# Patient Record
Sex: Female | Born: 1985 | Race: White | Hispanic: No | Smoking: Never smoker
Health system: Southern US, Community
[De-identification: ages and names within clinical notes are randomized; demographics above are authoritative.]

## PROBLEM LIST (undated history)

## (undated) DIAGNOSIS — E079 Disorder of thyroid, unspecified: Secondary | ICD-10-CM

## (undated) DIAGNOSIS — E282 Polycystic ovarian syndrome: Secondary | ICD-10-CM

## (undated) DIAGNOSIS — R51 Headache: Secondary | ICD-10-CM

## (undated) DIAGNOSIS — I10 Essential (primary) hypertension: Secondary | ICD-10-CM

## (undated) DIAGNOSIS — R519 Headache, unspecified: Secondary | ICD-10-CM

## (undated) HISTORY — DX: Polycystic ovarian syndrome: E28.2

## (undated) HISTORY — DX: Headache: R51

## (undated) HISTORY — DX: Disorder of thyroid, unspecified: E07.9

## (undated) HISTORY — DX: Essential (primary) hypertension: I10

## (undated) HISTORY — DX: Headache, unspecified: R51.9

## (undated) HISTORY — PX: BIOPSY THYROID: PRO38

---

## 2012-03-17 ENCOUNTER — Other Ambulatory Visit: Payer: Self-pay

## 2012-03-17 ENCOUNTER — Encounter (HOSPITAL_COMMUNITY): Payer: Self-pay | Admitting: Emergency Medicine

## 2012-03-17 ENCOUNTER — Emergency Department (HOSPITAL_COMMUNITY)
Admission: EM | Admit: 2012-03-17 | Discharge: 2012-03-18 | Disposition: A | Discharge status: Home / Self Care | Payer: PRIVATE HEALTH INSURANCE | Attending: Emergency Medicine | Admitting: Emergency Medicine

## 2012-03-17 DIAGNOSIS — R079 Chest pain, unspecified: Secondary | ICD-10-CM | POA: Insufficient documentation

## 2012-03-17 LAB — BASIC METABOLIC PANEL
CO2: 27 mEq/L (ref 19–32)
Chloride: 98 mEq/L (ref 96–112)
Potassium: 3.9 mEq/L (ref 3.5–5.1)
Sodium: 136 mEq/L (ref 135–145)

## 2012-03-17 LAB — CBC
MCV: 84.8 fL (ref 78.0–100.0)
Platelets: 335 10*3/uL (ref 150–400)
RBC: 4.86 MIL/uL (ref 3.87–5.11)
WBC: 8.4 10*3/uL (ref 4.0–10.5)

## 2012-03-17 LAB — PREGNANCY, URINE: Preg Test, Ur: NEGATIVE

## 2012-03-17 NOTE — ED Notes (Signed)
Pt alert, arrives from home, c/o mid epigastric non radiating chest pain, onset several weeks ago, resp even unlabored, skin pwd, denies n/v

## 2012-03-18 ENCOUNTER — Emergency Department (HOSPITAL_COMMUNITY): Payer: PRIVATE HEALTH INSURANCE

## 2012-03-18 LAB — TROPONIN I: Troponin I: <0.3 ng/mL (ref <0.30)

## 2012-03-18 NOTE — ED Provider Notes (Signed)
History     CSN: [id number]  Arrival date & time 03/17/12  2305   First MD Initiated Contact with Patient 03/18/12 [phone]      Chief Complaint  Patient presents with  . Chest Pain    (Consider location/radiation/quality/duration/timing/severity/associated sxs/prior treatment) HPI This is a 26 year old white female with a history of chest pain. She has had several weeks of a burning pain in her chest. Since 5:30 PM yesterday the pain has become more of a dull, aching pain. It is located in the left parasternal and submammary areas. It is sometimes worse with deep breathing. It is not worse with palpation or movement. It is mild to moderate at its worst. It is intermittent lasting several minutes at a time. There is no specific trigger known. It is not associated with shortness of breath, nausea or diaphoresis. She denies smoking, birth control use, family history of coronary artery disease, lower extremity swelling or edema, or recent prolonged automobile or airplane trip.   History reviewed. No pertinent past medical history.  History reviewed. No pertinent past surgical history.  No family history on file.  History  Substance Use Topics  . Smoking status: Never Smoker   . Smokeless tobacco: Not on file  . Alcohol Use: No    OB History    Grav Para Term Preterm Abortions TAB SAB Ect Mult Living                  Review of Systems  All other systems reviewed and are negative.    Allergies  Review of patient's allergies indicates no known allergies.  Home Medications   Current Outpatient Rx  Name Route Sig Dispense Refill  . IBUPROFEN 200 MG PO TABS Oral Take 600 mg by mouth every 6 (six) hours as needed. Pain      BP 123/74  Pulse 65  Temp 98.3 F (36.8 C) (Oral)  Resp 20  Wt 270 lb (122.471 kg)  SpO2 99%  LMP 02/13/2012  Physical Exam General: Well-developed, well-nourished female in no acute distress; appearance consistent with age of record HENT:  normocephalic, atraumatic Eyes: pupils equal round and reactive to light; extraocular muscles intact Neck: supple Heart: regular rate and rhythm; no murmurs, rubs or gallops Lungs: clear to auscultation bilaterally Chest: Nontender Abdomen: soft; nondistended Extremities: No deformity; full range of motion; pulses normal; no edema Neurologic: Awake, alert and oriented; motor function intact in all extremities and symmetric; no facial droop Skin: Warm and dry Psychiatric: Normal mood and affect    ED Course  Procedures (including critical care time)    MDM   Nursing notes and vitals signs, including pulse oximetry, reviewed.  Summary of this visit's results, reviewed by myself:  Labs:  Results for orders placed during the hospital encounter of 03/17/12  CBC      Component Value Range   WBC 8.4  4.0 - 10.5 K/uL   RBC 4.86  3.87 - 5.11 MIL/uL   Hemoglobin 14.1  12.0 - 15.0 g/dL   HCT 11.9  14.7 - 82.9 %   MCV 84.8  78.0 - 100.0 fL   MCH 29.0  26.0 - 34.0 pg   MCHC 34.2  30.0 - 36.0 g/dL   RDW 56.2  13.0 - 86.5 %   Platelets 335  150 - 400 K/uL  BASIC METABOLIC PANEL      Component Value Range   Sodium 136  135 - 145 mEq/L   Potassium 3.9  3.5 - 5.1  mEq/L   Chloride 98  96 - 112 mEq/L   CO2 27  19 - 32 mEq/L   Glucose, Bld 93  70 - 99 mg/dL   BUN 11  6 - 23 mg/dL   Creatinine, Ser 4.09  0.50 - 1.10 mg/dL   Calcium 9.3  8.4 - 81.1 mg/dL   GFR calc non Af Amer 74 (*) >90 mL/min   GFR calc Af Amer 86 (*) >90 mL/min  PREGNANCY, URINE      Component Value Range   Preg Test, Ur NEGATIVE  NEGATIVE  TROPONIN I      Component Value Range   Troponin I <0.30  <0.30 ng/mL    Imaging Studies: Dg Chest 2 View  03/18/2012  *RADIOLOGY REPORT*  Clinical Data: Chest pain.  CHEST - 2 VIEW  Comparison: None.  Findings:  Cardiopericardial silhouette within normal limits. Mediastinal contours normal. Trachea midline.  No airspace disease or effusion.  IMPRESSION: No active  cardiopulmonary disease.   Original Report Authenticated By: Andreas Newport, M.D.    EKG Interpretation:  Date & Time: 03/17/2012 11:28 AM  Rate: 63  Rhythm: normal sinus rhythm  QRS Axis: normal  Intervals: normal  ST/T Wave abnormalities: normal  Conduction Disutrbances:nonspecific intraventricular conduction delay  Narrative Interpretation:   Old EKG Reviewed: none available  5:51 AM will refer to [facility]. Differential diagnosis includes mitral valve prolapse which can cause atypical chest pain.             Hanley Seamen, MD 03/18/12 [phone]

## 2014-06-30 ENCOUNTER — Other Ambulatory Visit: Payer: Self-pay | Admitting: Family Medicine

## 2014-06-30 DIAGNOSIS — R7989 Other specified abnormal findings of blood chemistry: Secondary | ICD-10-CM

## 2014-07-03 ENCOUNTER — Other Ambulatory Visit: Payer: PRIVATE HEALTH INSURANCE

## 2014-10-13 ENCOUNTER — Other Ambulatory Visit: Payer: Self-pay | Admitting: Family Medicine

## 2014-10-13 DIAGNOSIS — R7989 Other specified abnormal findings of blood chemistry: Secondary | ICD-10-CM

## 2014-11-06 ENCOUNTER — Ambulatory Visit
Admission: RE | Admit: 2014-11-06 | Discharge: 2014-11-06 | Disposition: A | Discharge status: Home / Self Care | Payer: 59 | Source: Ambulatory Visit | Attending: Family Medicine | Admitting: Family Medicine

## 2014-11-06 DIAGNOSIS — R7989 Other specified abnormal findings of blood chemistry: Secondary | ICD-10-CM

## 2014-11-19 ENCOUNTER — Other Ambulatory Visit: Payer: Self-pay | Admitting: Family Medicine

## 2014-11-19 DIAGNOSIS — E041 Nontoxic single thyroid nodule: Secondary | ICD-10-CM

## 2014-12-22 ENCOUNTER — Ambulatory Visit
Admission: RE | Admit: 2014-12-22 | Discharge: 2014-12-22 | Disposition: A | Discharge status: Home / Self Care | Payer: 59 | Source: Ambulatory Visit | Attending: Family Medicine | Admitting: Family Medicine

## 2014-12-22 ENCOUNTER — Other Ambulatory Visit (HOSPITAL_COMMUNITY)
Admission: RE | Admit: 2014-12-22 | Discharge: 2014-12-22 | Disposition: A | Discharge status: Home / Self Care | Payer: 59 | Source: Ambulatory Visit | Attending: Interventional Radiology | Admitting: Interventional Radiology

## 2014-12-22 DIAGNOSIS — E041 Nontoxic single thyroid nodule: Secondary | ICD-10-CM | POA: Diagnosis present

## 2015-03-04 ENCOUNTER — Other Ambulatory Visit: Payer: Self-pay | Admitting: Family Medicine

## 2015-03-04 DIAGNOSIS — Z8249 Family history of ischemic heart disease and other diseases of the circulatory system: Secondary | ICD-10-CM

## 2015-03-08 ENCOUNTER — Inpatient Hospital Stay: Admission: RE | Admit: 2015-03-08 | Payer: 59 | Source: Ambulatory Visit

## 2016-05-08 IMAGING — US US THYROID BIOPSY
1 series · 14 of 15 positions shown · non-contrast
Comparison: none

CLINICAL DATA: dominant left thyroid nodule

EXAM:
ULTRASOUND-GUIDED THYROID ASPIRATION BIOPSY
TECHNIQUE: The procedure, risks (including but not limited to bleeding,
infection, organ damage ), benefits, and alternatives were explained
to the patient. Questions regarding the procedure were encouraged
and answered. The patient understands and consents to the procedure.

[Series 1: us thyroid biopsy · 0.07mm/px · 15 acquisitions, 14 frames shown]
[im 1/15]
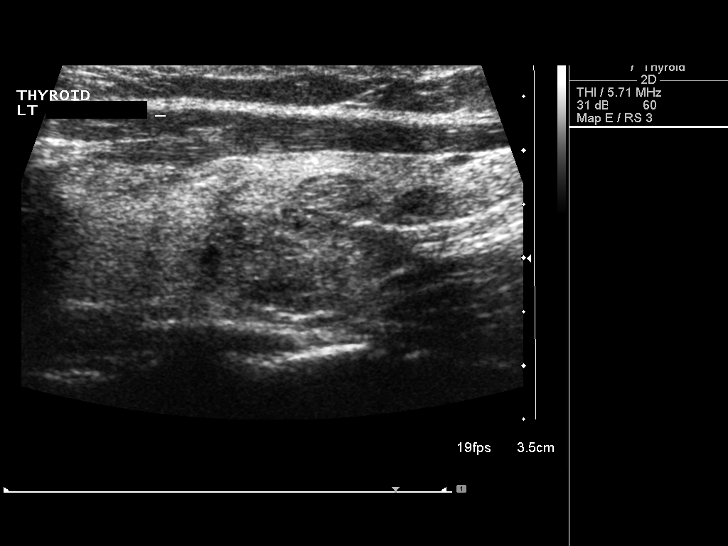
[im 2/15]
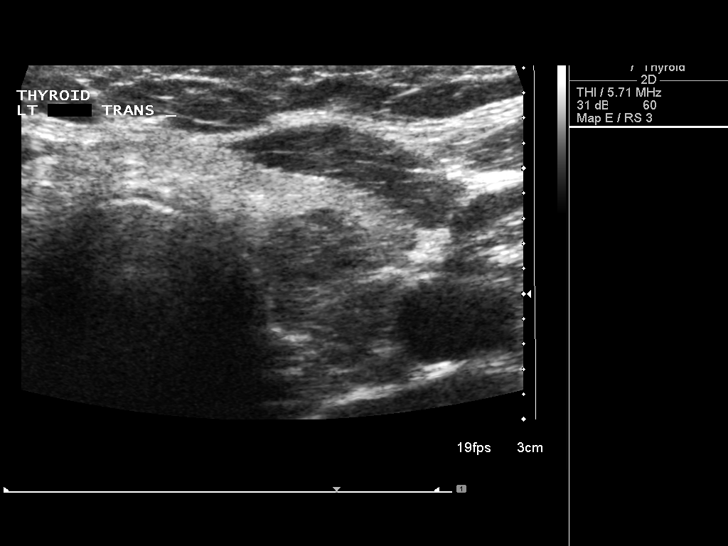
[im 3/15]
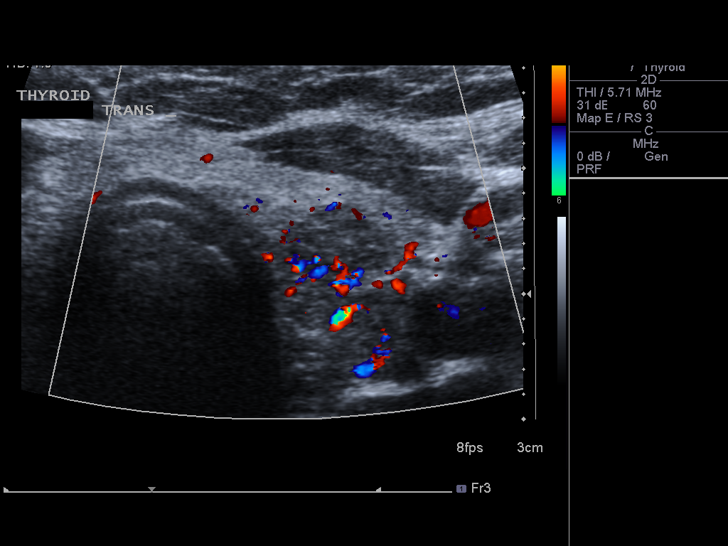
[im 4/15]
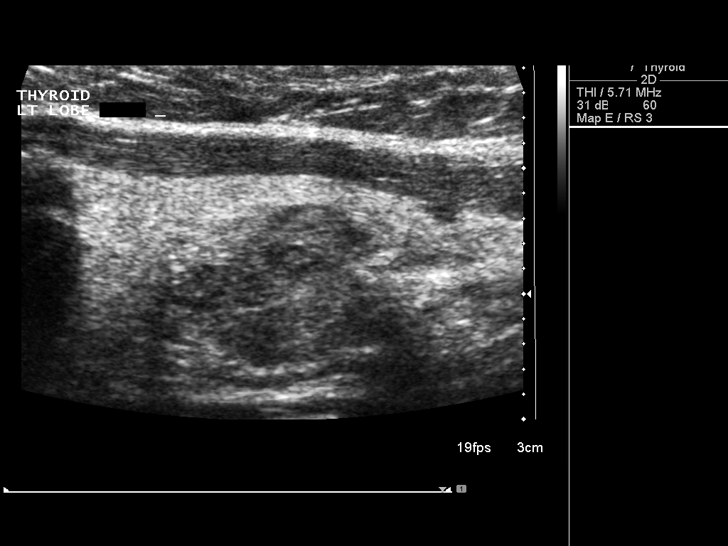
[im 5/15]
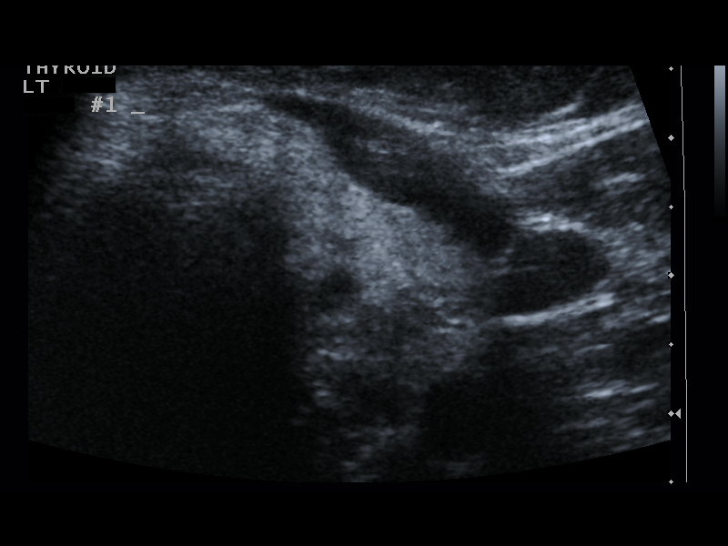
[im 6/15]
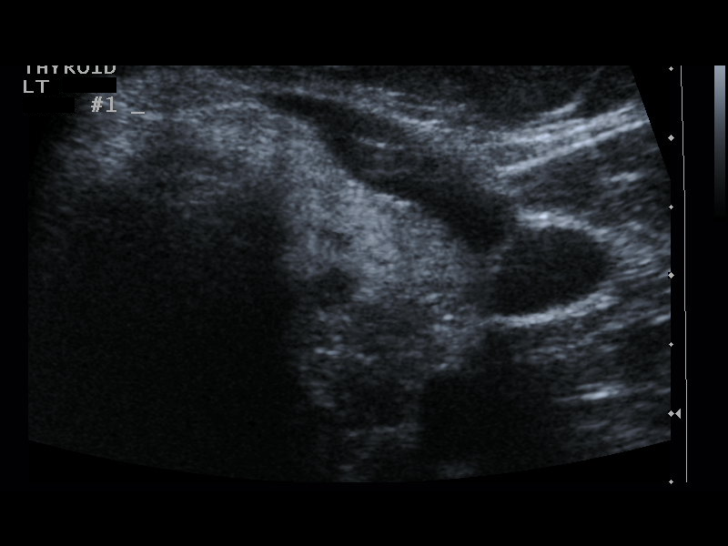
[im 7/15]
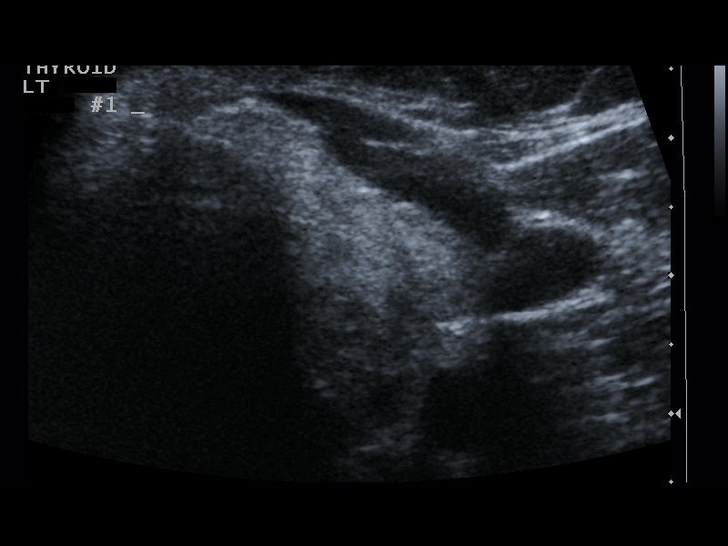
[im 9/15]
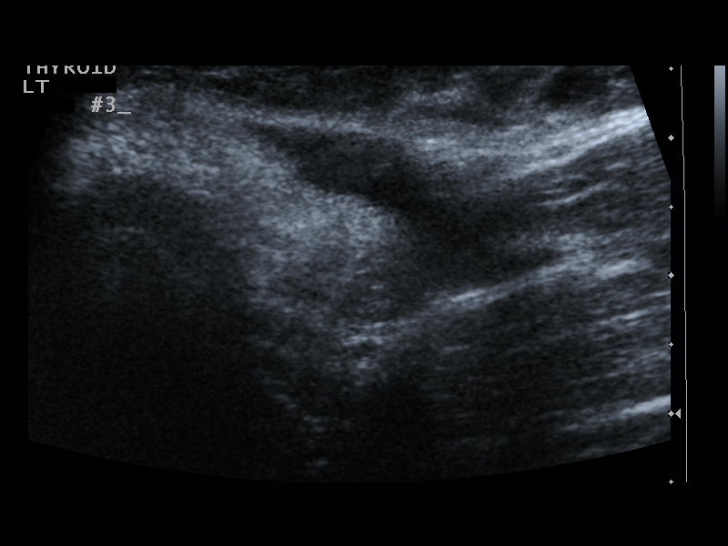
[im 10/15]
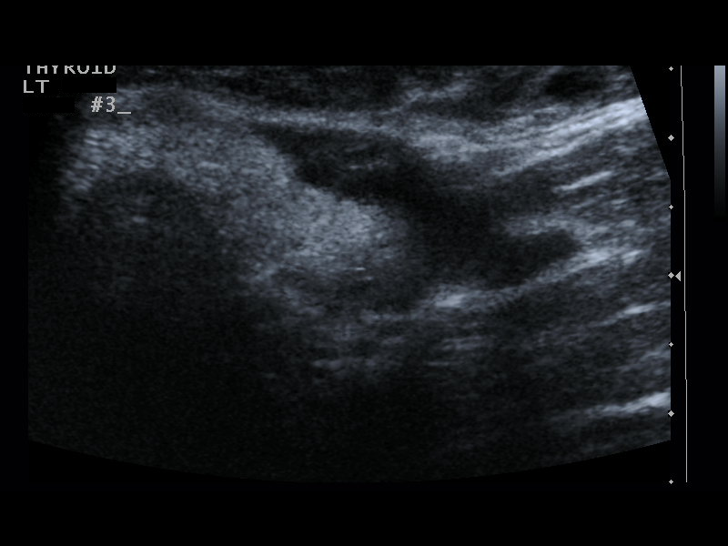
[im 11/15]
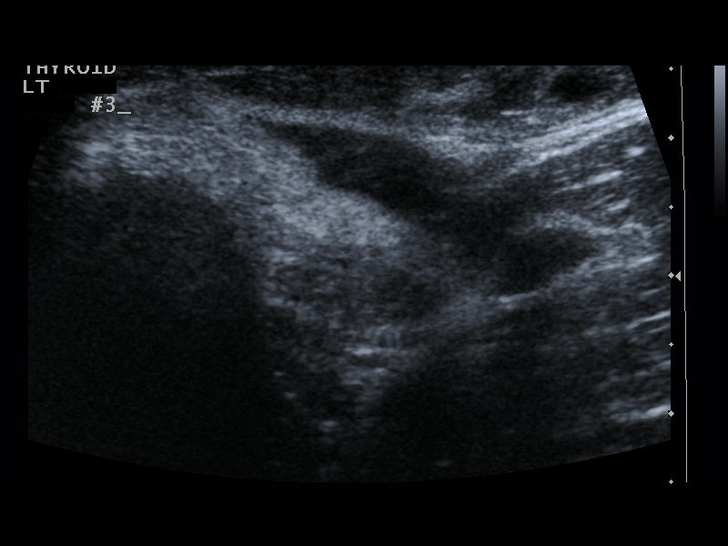
[im 12/15]
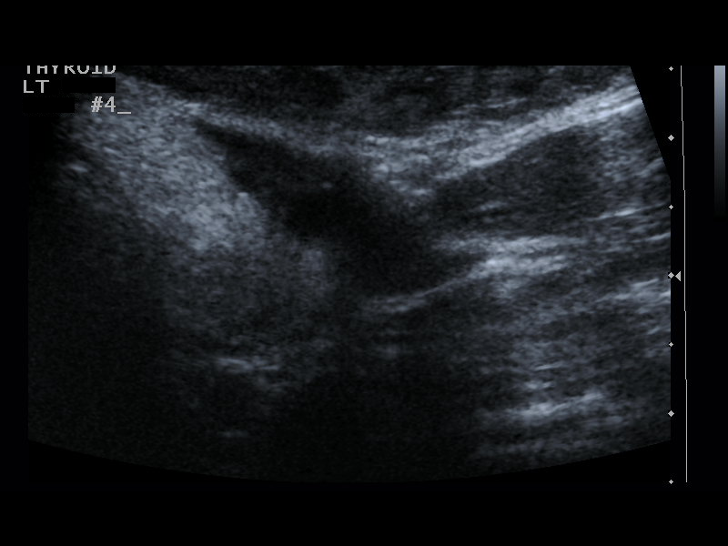
[im 13/15]
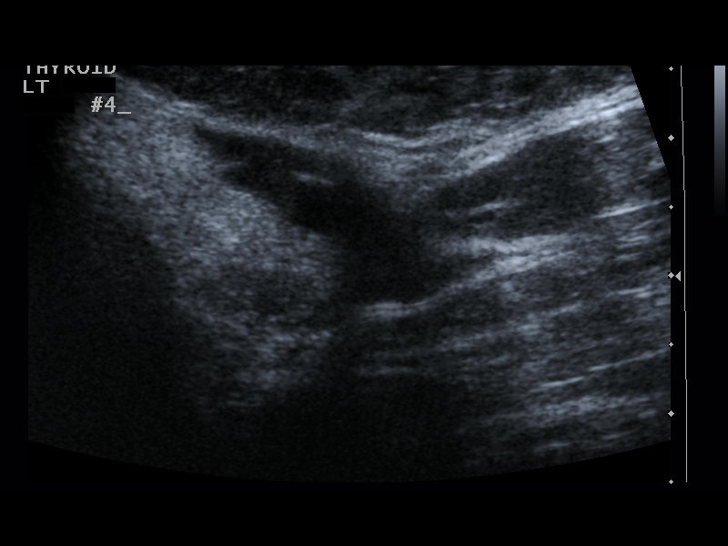
[im 14/15]
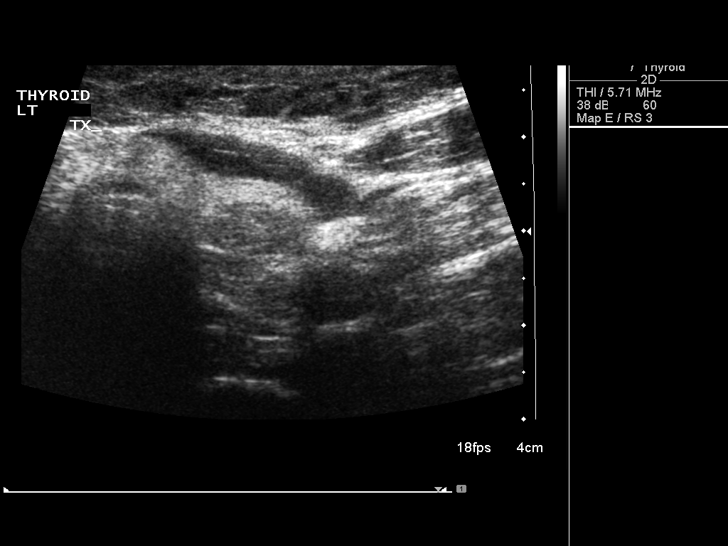
[im 15/15]
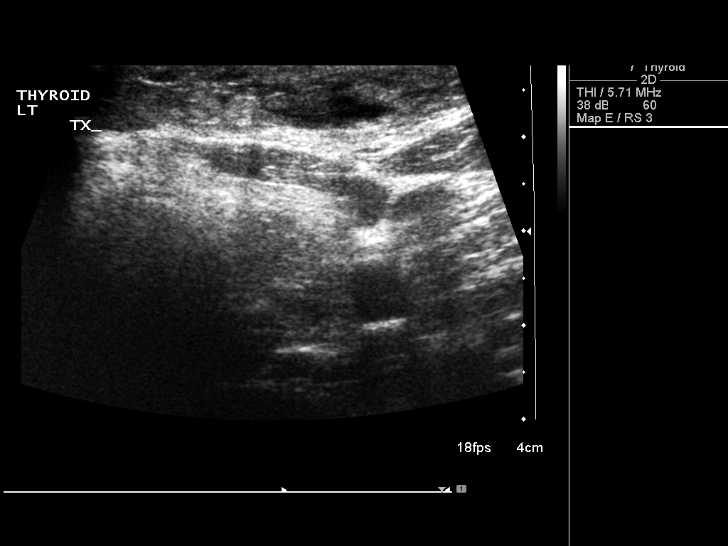

[14 of 15 positions shown; findings below may reference images not displayed]

Survey ultrasound was performed and the dominant lesion in the left
lobe was localized. An appropriate skin entry site was determined.
Skin was marked, then prepped with Betadine, draped in usual sterile
fashion, and infiltrated locally with 1% lidocaine. Under real-time
ultrasound guidance, 4 passes were made into the lesion with 25
gauge needles. The patient tolerated procedure well.

COMPLICATIONS:
COMPLICATIONS
none
IMPRESSION: 1. Technically successful ultrasound-guided thyroid aspiration
biopsy , dominant left nodule.

## 2016-06-12 ENCOUNTER — Encounter: Payer: Self-pay | Admitting: *Deleted

## 2016-06-13 ENCOUNTER — Ambulatory Visit (INDEPENDENT_AMBULATORY_CARE_PROVIDER_SITE_OTHER): Payer: 59 | Admitting: Diagnostic Neuroimaging

## 2016-06-13 ENCOUNTER — Encounter: Payer: Self-pay | Admitting: Diagnostic Neuroimaging

## 2016-06-13 VITALS — BP 137/96 | HR 66 | Ht 66.5 in | Wt 307.0 lb

## 2016-06-13 DIAGNOSIS — R519 Headache, unspecified: Secondary | ICD-10-CM

## 2016-06-13 DIAGNOSIS — R51 Headache: Secondary | ICD-10-CM | POA: Diagnosis not present

## 2016-06-13 DIAGNOSIS — G43009 Migraine without aura, not intractable, without status migrainosus: Secondary | ICD-10-CM

## 2016-06-13 DIAGNOSIS — Z8249 Family history of ischemic heart disease and other diseases of the circulatory system: Secondary | ICD-10-CM | POA: Diagnosis not present

## 2016-06-13 MED ORDER — TOPIRAMATE 50 MG PO TABS: 50.0000 mg | ORAL_TABLET | Freq: Two times a day (BID) | ORAL | 12 refills | Status: DC

## 2016-06-13 MED ORDER — RIZATRIPTAN BENZOATE 10 MG PO TBDP: 10.0000 mg | ORAL_TABLET | ORAL | 11 refills | Status: DC | PRN

## 2016-06-13 NOTE — Progress Notes (Signed)
[facility]  PATIENT: Wendy Tapia DOB: 11/22/1985  REFERRING CLINICIAN: Billy Coastaavon, R HISTORY FROM: patient  REASON FOR VISIT: new consult    HISTORICAL  CHIEF COMPLAINT:  Chief Complaint  Patient presents with  . Migraine    rm 6 , New Pt, "migraines for 2 wks almost daily, decreased since about 2 wks; take Excedrin as needed"    HISTORY OF PRESENT ILLNESS:   30 year old female here for valuation of headaches. Patient reports onset of migraine headaches age 30 years old, average one day of headache per month. She describes unilateral, left-sided, left temporal headache with left eye pain, associated with throbbing pain, photophobia, phonophobia and nausea. Over the past 2 months she has been having increasing frequency of headaches approximately every other day. For the last 2 weeks symptoms have been improving.  Patient also has intermittent sharp shooting pains in her left temporal region to her left eye. Patient has been having back and neck pain. Moving around seems to aggravate symptoms. No other specific triggering factors in the last few months. No aura.   REVIEW OF SYSTEMS: Full 14 system review of systems performed and negative with exception of: 100 pound weight gain over the past 5 years. Chest pain birthmarks allergies anxiety headache snoring. Patient had eye exam one month ago which was normal.  ALLERGIES: Allergies  Allergen Reactions  . Lactose Intolerance (Gi)   . Other     Alcohol- queasy, get hot, temp rises, get flushed  . Latex Rash    HOME MEDICATIONS: Outpatient Medications Prior to Visit  Medication Sig Dispense Refill  . ibuprofen (ADVIL,MOTRIN) 200 MG tablet Take 600 mg by mouth every 6 (six) hours as needed. Pain     No facility-administered medications prior to visit.     PAST MEDICAL HISTORY: Past Medical History:  Diagnosis Date  . Headache    migraine  . Thyroid disease     PAST SURGICAL HISTORY: Past  Surgical History:  Procedure Laterality Date  . BIOPSY THYROID     benign    FAMILY HISTORY: Family History  Problem Relation Age of Onset  . Aneurysm Father     brain, surgery  . Hypertension Father   . Cancer Maternal Grandmother     stomach    SOCIAL HISTORY:  Social History   Social History  . Marital status: Married    Spouse name: Lyda JesterCurtis  . Number of children: 0  . Years of education: 7016   Occupational History  .      HR Assistant, [employer]   Social History Main Topics  . Smoking status: Never Smoker  . Smokeless tobacco: Never Used  . Alcohol use No     Comment: intolerance to alcohol  . Drug use: No  . Sexual activity: Not on file   Other Topics Concern  . Not on file   Social History Narrative   Lives with husband   Caffeine - occas     PHYSICAL EXAM  GENERAL EXAM/CONSTITUTIONAL: Vitals:  Vitals:   06/13/16 0832  BP: (!) 137/96  Pulse: 66  Weight: (!) 307 lb (139.3 kg)  Height: 5' 6.5" (1.689 m)     Body mass index is 48.81 kg/m.  Visual Acuity Screening   Right eye Left eye Both eyes  Without correction: 20/20 20/20   With correction:        Patient is in no distress; well developed, nourished and groomed; neck is supple  CARDIOVASCULAR:  Examination of carotid  arteries is normal; no carotid bruits  Regular rate and rhythm, no murmurs  Examination of peripheral vascular system by observation and palpation is normal  EYES:  Ophthalmoscopic exam of optic discs and posterior segments is normal; no papilledema or hemorrhages  MUSCULOSKELETAL:  Gait, strength, tone, movements noted in Neurologic exam below  NEUROLOGIC: MENTAL STATUS:  No flowsheet data found.  awake, alert, oriented to person, place and time  recent and remote memory intact  normal attention and concentration  language fluent, comprehension intact, naming intact,   fund of knowledge appropriate  CRANIAL NERVE:   2nd - no papilledema on  fundoscopic exam  2nd, 3rd, 4th, 6th - pupils equal and reactive to light, visual fields full to confrontation, extraocular muscles intact, no nystagmus  5th - facial sensation symmetric  7th - facial strength symmetric  8th - hearing intact  9th - palate elevates symmetrically, uvula midline  11th - shoulder shrug symmetric  12th - tongue protrusion midline  MOTOR:   normal bulk and tone, full strength in the BUE, BLE  SENSORY:   normal and symmetric to light touch, pinprick, temperature, vibration  COORDINATION:   finger-nose-finger, fine finger movements normal  REFLEXES:   deep tendon reflexes present and symmetric  GAIT/STATION:   narrow based gait; able to walk on toes, heels and tandem; romberg is negative    DIAGNOSTIC DATA (LABS, IMAGING, TESTING) - I reviewed patient records, labs, notes, testing and imaging myself where available.  Lab Results  Component Value Date   WBC 8.4 03/17/2012   HGB 14.1 03/17/2012   HCT 41.2 03/17/2012   MCV 84.8 03/17/2012   PLT 335 03/17/2012      Component Value Date/Time   NA 136 03/17/2012 2318   K 3.9 03/17/2012 2318   CL 98 03/17/2012 2318   CO2 27 03/17/2012 2318   GLUCOSE 93 03/17/2012 2318   BUN 11 03/17/2012 2318   CREATININE 1.03 03/17/2012 2318   CALCIUM 9.3 03/17/2012 2318   GFRNONAA 74 (L) 03/17/2012 2318   GFRAA 86 (L) 03/17/2012 2318   No results found for: CHOL, HDL, LDLCALC, LDLDIRECT, TRIG, CHOLHDL No results found for: ZOXW9UHGBA1C No results found for: VITAMINB12 No results found for: TSH   03/19/15 CTA head [I reviewed images myself. I do not see any major findings. -VRP]  - normal study     ASSESSMENT AND PLAN  30 y.o. year old female here with history of migraine headaches since age 30 years old, with increasing severity and frequency of headaches in the last 2 months. Neurologic examination unremarkable. Patient has had 1 pound weight gain in the past 5 years. Will check additional  workup to rule out other causes of headache.   Ddx: migraine vs secondary headaches (stroke, tumor, inflammation)  1. New onset headache   2. Migraine without aura and without status migrainosus, not intractable   3. Family history of brain aneurysm      PLAN:  - check MRI and MRA head (due to increasing frequency of headaches; rule out stroke, tumor, aneurysm, subarachnoid hemorrhage; family h/o aneurysm in father)  - start topiramate 50mg  at bedtime; after 1 week increase to twice a day; drink plenty of water  - rizatriptan 10mg  as needed for breakthrough headache; may repeat x 1 after 2 hours; max 2 tabs per day or 8 per month  - headache education reviewed  - consider sleep study in future   Orders Placed This Encounter  Procedures  . MR BRAIN  W WO CONTRAST  . MR MRA HEAD WO CONTRAST   Meds ordered this encounter  Medications  . topiramate (TOPAMAX) 50 MG tablet    Sig: Take 1 tablet (50 mg total) by mouth 2 (two) times daily.    Dispense:  60 tablet    Refill:  12  . rizatriptan (MAXALT-MLT) 10 MG disintegrating tablet    Sig: Take 1 tablet (10 mg total) by mouth as needed for migraine. May repeat in 2 hours if needed    Dispense:  9 tablet    Refill:  11   Return in about 2 months (around 08/14/2016).  I reviewed images, labs, notes, records myself. I summarized findings and reviewed with patient, for this high risk condition (new onset severe headaches; family history of cerebral aneurysm) requiring high complexity decision making.    Suanne Marker, MD 06/13/2016, 9:32 AM Certified in Neurology, Neurophysiology and Neuroimaging  [facility] [address] [place], [place] [address] [phone]

## 2016-06-13 NOTE — Patient Instructions (Signed)
Thank you for coming to see [place] at [facility]. I hope we have been able to provide you high quality care today.  You may receive a patient satisfaction survey over the next few weeks. We would appreciate your feedback and comments so that we may continue to improve ourselves and the health of our patients.  - check MRI and MRA head  - start topiramate 52m at bedtime; after 1 week increase to twice a day; drink plenty of water  - rizatriptan 171mas needed for breakthrough headache; may repeat x 1 after 2 hours; max 2 tabs per day or 8 per month  - To prevent or relieve headaches, try the following:   Cool Compress. Lie down and place a cool compress on your head.   Avoid headache triggers. If certain foods or odors seem to have triggered your migraines in the past, avoid them. A headache diary might help you identify triggers.   Include physical activity in your daily routine.   Manage stress. Find healthy ways to cope with the stressors, such as delegating tasks on your to-do list.   Practice relaxation techniques. Try deep breathing, yoga, massage and visualization.   Eat regularly. Eating regularly scheduled meals and maintaining a healthy diet might help prevent headaches. Also, drink plenty of fluids.   Follow a regular sleep schedule. Sleep deprivation might contribute to headaches  Consider biofeedback. With this mind-body technique, you learn to control certain bodily functions - such as muscle tension, heart rate and blood pressure - to prevent headaches or reduce headache pain.    ~~~~~~~~~~~~~~~~~~~~~~~~~~~~~~~~~~~~~~~~~~~~~~~~~~~~~~~~~~~~~~~~~  DR. Jaquaya Coyle'S GUIDE TO HAPPY AND HEALTHY LIVING These are some of my general health and wellness recommendations. Some of them may apply to you better than others. Please use common sense as you try these suggestions and feel free to ask me any questions.   ACTIVITY/FITNESS Mental, social, emotional  and physical stimulation are very important for brain and body health. Try learning a new activity (arts, music, language, sports, games).  Keep moving your body to the best of your abilities. You can do this at home, inside or outside, the park, community center, gym or anywhere you like. Consider a physical therapist or personal trainer to get started. Consider the app Sworkit. Fitness trackers such as smart-watches, smart-phones or Fitbits can help as well.   NUTRITION Eat more plants: colorful vegetables, nuts, seeds and berries.  Eat less sugar, salt, preservatives and processed foods.  Avoid toxins such as cigarettes and alcohol.  Drink water when you are thirsty. Warm water with a slice of lemon is an excellent morning drink to start the day.  Consider these websites for more information The Nutrition Source (hthttps://www.henry-hernandez.biz/[employer] (wwWindowBlog.ch  RELAXATION Consider practicing mindfulness meditation or other relaxation techniques such as deep breathing, prayer, yoga, tai chi, massage. See website mindful.org or the apps Headspace or Calm to help get started.   SLEEP Try to get at least 7-8+ hours sleep per day. Regular exercise and reduced caffeine will help you sleep better. Practice good sleep hygeine techniques. See website sleep.org for more information.   PLANNING Prepare estate planning, living will, healthcare POA documents. Sometimes this is best planned with the help of an attorney. Theconversationproject.org and agingwithdignity.org are excellent resources.

## 2016-08-02 ENCOUNTER — Other Ambulatory Visit: Payer: Self-pay | Admitting: Family Medicine

## 2016-08-02 DIAGNOSIS — E039 Hypothyroidism, unspecified: Secondary | ICD-10-CM

## 2016-08-09 ENCOUNTER — Other Ambulatory Visit: Payer: 59

## 2016-08-16 ENCOUNTER — Ambulatory Visit
Admission: RE | Admit: 2016-08-16 | Discharge: 2016-08-16 | Disposition: A | Discharge status: Home / Self Care | Payer: BLUE CROSS/BLUE SHIELD | Source: Ambulatory Visit | Attending: Family Medicine | Admitting: Family Medicine

## 2016-08-16 DIAGNOSIS — E039 Hypothyroidism, unspecified: Secondary | ICD-10-CM

## 2016-08-21 ENCOUNTER — Other Ambulatory Visit: Payer: Self-pay | Admitting: Family Medicine

## 2016-08-21 DIAGNOSIS — E041 Nontoxic single thyroid nodule: Secondary | ICD-10-CM

## 2016-08-22 ENCOUNTER — Other Ambulatory Visit (HOSPITAL_COMMUNITY)
Admission: RE | Admit: 2016-08-22 | Discharge: 2016-08-22 | Disposition: A | Discharge status: Home / Self Care | Payer: BLUE CROSS/BLUE SHIELD | Source: Ambulatory Visit | Attending: Radiology | Admitting: Radiology

## 2016-08-22 ENCOUNTER — Ambulatory Visit
Admission: RE | Admit: 2016-08-22 | Discharge: 2016-08-22 | Disposition: A | Discharge status: Home / Self Care | Payer: BLUE CROSS/BLUE SHIELD | Source: Ambulatory Visit | Attending: Family Medicine | Admitting: Family Medicine

## 2016-08-22 DIAGNOSIS — E041 Nontoxic single thyroid nodule: Secondary | ICD-10-CM

## 2016-08-22 DIAGNOSIS — E042 Nontoxic multinodular goiter: Secondary | ICD-10-CM | POA: Insufficient documentation

## 2016-08-30 ENCOUNTER — Ambulatory Visit: Payer: 59 | Admitting: Diagnostic Neuroimaging

## 2017-08-29 ENCOUNTER — Other Ambulatory Visit: Payer: Self-pay | Admitting: Family Medicine

## 2017-08-29 DIAGNOSIS — E039 Hypothyroidism, unspecified: Secondary | ICD-10-CM

## 2017-12-29 ENCOUNTER — Other Ambulatory Visit: Payer: Self-pay

## 2017-12-29 ENCOUNTER — Encounter (HOSPITAL_COMMUNITY): Payer: Self-pay

## 2017-12-29 ENCOUNTER — Emergency Department (HOSPITAL_COMMUNITY)
Admission: EM | Admit: 2017-12-29 | Discharge: 2017-12-29 | Disposition: A | Discharge status: Home / Self Care | Payer: BLUE CROSS/BLUE SHIELD | Attending: Emergency Medicine | Admitting: Emergency Medicine

## 2017-12-29 ENCOUNTER — Emergency Department (HOSPITAL_COMMUNITY): Payer: BLUE CROSS/BLUE SHIELD

## 2017-12-29 DIAGNOSIS — R0789 Other chest pain: Secondary | ICD-10-CM | POA: Diagnosis not present

## 2017-12-29 DIAGNOSIS — Z7982 Long term (current) use of aspirin: Secondary | ICD-10-CM | POA: Insufficient documentation

## 2017-12-29 DIAGNOSIS — Z91040 Latex allergy status: Secondary | ICD-10-CM | POA: Diagnosis not present

## 2017-12-29 DIAGNOSIS — E079 Disorder of thyroid, unspecified: Secondary | ICD-10-CM | POA: Insufficient documentation

## 2017-12-29 DIAGNOSIS — R079 Chest pain, unspecified: Secondary | ICD-10-CM | POA: Diagnosis present

## 2017-12-29 DIAGNOSIS — Z79899 Other long term (current) drug therapy: Secondary | ICD-10-CM | POA: Diagnosis not present

## 2017-12-29 LAB — I-STAT BETA HCG BLOOD, ED (MC, WL, AP ONLY)

## 2017-12-29 LAB — BASIC METABOLIC PANEL
ANION GAP: 10 (ref 5–15)
BUN: 15 mg/dL (ref 6–20)
CHLORIDE: 107 mmol/L (ref 98–111)
CO2: 25 mmol/L (ref 22–32)
Calcium: 9.2 mg/dL (ref 8.9–10.3)
Creatinine, Ser: 0.93 mg/dL (ref 0.44–1.00)
GFR calc Af Amer: >60 mL/min (ref >60)
Glucose, Bld: 94 mg/dL (ref 70–99)
POTASSIUM: 4.1 mmol/L (ref 3.5–5.1)
SODIUM: 142 mmol/L (ref 135–145)

## 2017-12-29 LAB — CBC
HCT: 43.4 % (ref 36.0–46.0)
HEMOGLOBIN: 14.7 g/dL (ref 12.0–15.0)
MCH: 29.6 pg (ref 26.0–34.0)
MCHC: 33.9 g/dL (ref 30.0–36.0)
MCV: 87.3 fL (ref 78.0–100.0)
Platelets: 363 10*3/uL (ref 150–400)
RBC: 4.97 MIL/uL (ref 3.87–5.11)
RDW: 12.8 % (ref 11.5–15.5)
WBC: 9 10*3/uL (ref 4.0–10.5)

## 2017-12-29 LAB — I-STAT TROPONIN, ED: Troponin i, poc: 0 ng/mL (ref 0.00–0.08)

## 2017-12-29 NOTE — Discharge Instructions (Signed)
Labs, EKG and chest x-ray do not suggest an acute problem with your heart or lungs causing the pain you experienced today.  This could have been due to a muscle or esophageal spasm.  Please follow-up with your primary care doctor for recheck, if symptoms recur and are more persistent or you have chest pain that is worse with exertion, any shortness of breath, or you feel like you are going to pass out or any other new or concerning symptoms return to the emergency department.

## 2017-12-29 NOTE — ED Provider Notes (Signed)
[facility]-EMERGENCY DEPT Provider Note   CSN: [id number] Arrival date & time: 12/29/17  1749     History   Chief Complaint Chief Complaint  Patient presents with  . Chest Pain    HPI Wendy Tapia is a 32 y.o. female.  Wendy Tapia is a 32 y.o. Female with a history of headaches and thyroid disease, who presents to the emergency department for evaluation of sudden onset episode of mid chest pain.  She reports the pain is well localized just to the left of the lower portion of the sternum she reports it felt like a tight spasm that was present for approximately 5 minutes and then resolved on its own without intervention.  Patient reports pain was not exertional, not worse with deep breathing, she did feel like it was worse with movement.  She denies any radiation to the arm neck or jaw.  Did not experience any numbness or paresthesias in her extremities.  Denies associated lightheadedness or syncope.  No shortness of breath.  Patient denies any swelling or discomfort in her lower legs, no recent surgeries or long distance travel, no exogenous estrogen use, no history of DVT or PE or family history of clotting disorder, no cough or hemoptysis.  Aside from being overweight patient does not have history of diabetes, hyperlipidemia, smoking or family history of cardiac disease to increase her risk for cardiac events.     Past Medical History:  Diagnosis Date  . Headache    migraine  . Thyroid disease     There are no active problems to display for this patient.   Past Surgical History:  Procedure Laterality Date  . BIOPSY THYROID     benign     OB History   None      Home Medications    Prior to Admission medications   Medication Sig Start Date End Date Taking? Authorizing Provider  Aspirin-Acetaminophen-Caffeine (EXCEDRIN PO) Take by mouth as needed.   Yes [provider]  cetirizine (ZYRTEC) 10 MG tablet Take 10 mg by mouth daily  as needed for allergies.   Yes [provider]  levothyroxine (SYNTHROID, LEVOTHROID) 50 MCG tablet Take 50 mcg by mouth daily before breakfast.   Yes [provider]  metFORMIN (GLUCOPHAGE) 1000 MG tablet Take 500 mg by mouth every evening.   Yes [provider]  Prenatal Vit-Fe Fumarate-FA (PRENATAL MULTIVITAMIN) TABS tablet Take 1 tablet by mouth daily at 12 noon.   Yes [provider]  rizatriptan (MAXALT-MLT) 10 MG disintegrating tablet Take 1 tablet (10 mg total) by mouth as needed for migraine. May repeat in 2 hours if needed Patient not taking: Reported on 12/29/2017 06/13/16   Penumalli, Glenford Bayley, MD  topiramate (TOPAMAX) 50 MG tablet Take 1 tablet (50 mg total) by mouth 2 (two) times daily. Patient not taking: Reported on 12/29/2017 06/13/16   Suanne Marker, MD    Family History Family History  Problem Relation Age of Onset  . Aneurysm Father        brain, surgery  . Hypertension Father   . Cancer Maternal Grandmother        stomach    Social History Social History   Tobacco Use  . Smoking status: Never Smoker  . Smokeless tobacco: Never Used  Substance Use Topics  . Alcohol use: No    Comment: intolerance to alcohol  . Drug use: No     Allergies   Lactose intolerance (gi); Other; and Latex  Review of Systems Review of Systems  Constitutional: Negative for chills and fever.  HENT: Negative for congestion, rhinorrhea and sore throat.   Eyes: Negative for visual disturbance.  Respiratory: Negative for cough, chest tightness, shortness of breath and wheezing.   Cardiovascular: Positive for chest pain. Negative for palpitations and leg swelling.  Gastrointestinal: Negative for abdominal pain, diarrhea, nausea and vomiting.  Genitourinary: Negative for dysuria and frequency.  Musculoskeletal: Negative for arthralgias and myalgias.  Skin: Negative for color change and rash.  Neurological: Negative for dizziness, syncope,  weakness, light-headedness and numbness.     Physical Exam Updated Vital Signs BP (!) 157/104 (BP Location: Right Arm)   Pulse 70   Temp 98.2 F (36.8 C) (Oral)   Resp 18   LMP 12/24/2017 (Exact Date)   SpO2 100%   Physical Exam  Constitutional: She is oriented to person, place, and time. She appears well-developed and well-nourished.  Non-toxic appearance. She does not appear ill. No distress.  HENT:  Head: Normocephalic and atraumatic.  Mouth/Throat: Oropharynx is clear and moist.  Eyes: Right eye exhibits no discharge. Left eye exhibits no discharge.  Neck: Normal range of motion. Neck supple. No tracheal deviation present.  Cardiovascular: Normal rate, regular rhythm, normal heart sounds and intact distal pulses.  Pulses:      Radial pulses are 2+ on the right side, and 2+ on the left side.       Dorsalis pedis pulses are 2+ on the right side, and 2+ on the left side.       Posterior tibial pulses are 2+ on the right side, and 2+ on the left side.  Pulmonary/Chest: Effort normal and breath sounds normal. No respiratory distress.  Respirations equal and unlabored, patient able to speak in full sentences, lungs clear to auscultation bilaterally  Abdominal: Soft. Bowel sounds are normal. She exhibits no distension and no mass. There is no tenderness. There is no guarding.  Abdomen soft, nondistended, nontender to palpation in all quadrants without guarding or peritoneal signs  Musculoskeletal: She exhibits no edema or deformity.  Neurological: She is alert and oriented to person, place, and time. Coordination normal.  Skin: Skin is warm and dry. Capillary refill takes less than 2 seconds. She is not diaphoretic.  Psychiatric: She has a normal mood and affect. Her behavior is normal.  Nursing note and vitals reviewed.    ED Treatments / Results  Labs (all labs ordered are listed, but only abnormal results are displayed) Labs Reviewed  BASIC METABOLIC PANEL  CBC  I-STAT  TROPONIN, ED  I-STAT BETA HCG BLOOD, ED ([facility], [facility], [facility] ONLY)    EKG EKG Interpretation  Date/Time:  Saturday December 29 2017 18:05:19 EDT Ventricular Rate:  91 PR Interval:    QRS Duration: 92 QT Interval:  362 QTC Calculation: 446 R Axis:   28 Text Interpretation:  Sinus rhythm Low voltage, precordial leads Borderline abnrm T, anterolateral leads no sig change from previous Confirmed by Arby BarrettePfeiffer, Marcy [phone]([id number]) on 12/29/2017 11:18:46 PM   Radiology Dg Chest 2 View  Result Date: 12/29/2017 CLINICAL DATA:  Sudden onset of mid chest pain LEFT of midline EXAM: CHEST - 2 VIEW COMPARISON:  03/18/2012 FINDINGS: Normal heart size, mediastinal contours, and pulmonary vascularity. Lungs clear. No pleural effusion or pneumothorax. Bones unremarkable. IMPRESSION: Normal exam. Electronically Signed   By: Ulyses SouthwardMark  Boles M.D.   On: 12/29/2017 18:16    Procedures Procedures (including critical care time)  Medications Ordered in ED Medications - No data  to display   Initial Impression / Assessment and Plan / ED Course  I have reviewed the triage vital signs and the nursing notes.  Pertinent labs & imaging results that were available during my care of the patient were reviewed by me and considered in my medical decision making (see chart for details).  Patient is to be discharged with recommendation to follow up with PCP in regards to today's hospital visit. Chest pain is not likely of cardiac or pulmonary etiology d/t presentation, PERC negative, VSS, no tracheal deviation, no JVD or new murmur, RRR, breath sounds equal bilaterally, EKG without acute abnormalities, negative troponin, and negative CXR. Pt has been advised to return to the ED if CP becomes exertional, associated with diaphoresis or nausea, radiates to left jaw/arm, worsens or becomes concerning in any way. Pt appears reliable for follow up and is agreeable to discharge.   Vitals:   12/29/17 1806 12/29/17 2215  BP: (!) 157/104 (!) 134/92    Pulse: 70 65  Resp: 18 16  Temp: 98.2 F (36.8 C) 98.3 F (36.8 C)  TempSrc: Oral Oral  SpO2: 100% 100%     Final Clinical Impressions(s) / ED Diagnoses   Final diagnoses:  Atypical chest pain    ED Discharge Orders    None       Legrand Rams 12/29/17 2322    Arby Barrette, MD 01/01/18 1205

## 2017-12-29 NOTE — ED Triage Notes (Signed)
She c/o sudden onset of mid-chest area pain which is just to left of midline at substernal area. She states she had a similar "but a little different" episode of chest pain on 2013 at which time she was told she has MVP. [place] and was told by her cardiologist there that she does not have MVP. Her skin is normal, warm and dry and she is breathing normally. EKG/CXR/Labs performed at triage.

## 2019-05-16 IMAGING — CR DG CHEST 2V
2 series · 2 of 2 positions shown · non-contrast
Comparison: 03/18/2012

CLINICAL DATA: Sudden onset of mid chest pain LEFT of midline

EXAM:
CHEST - 2 VIEW

[w chest pa]
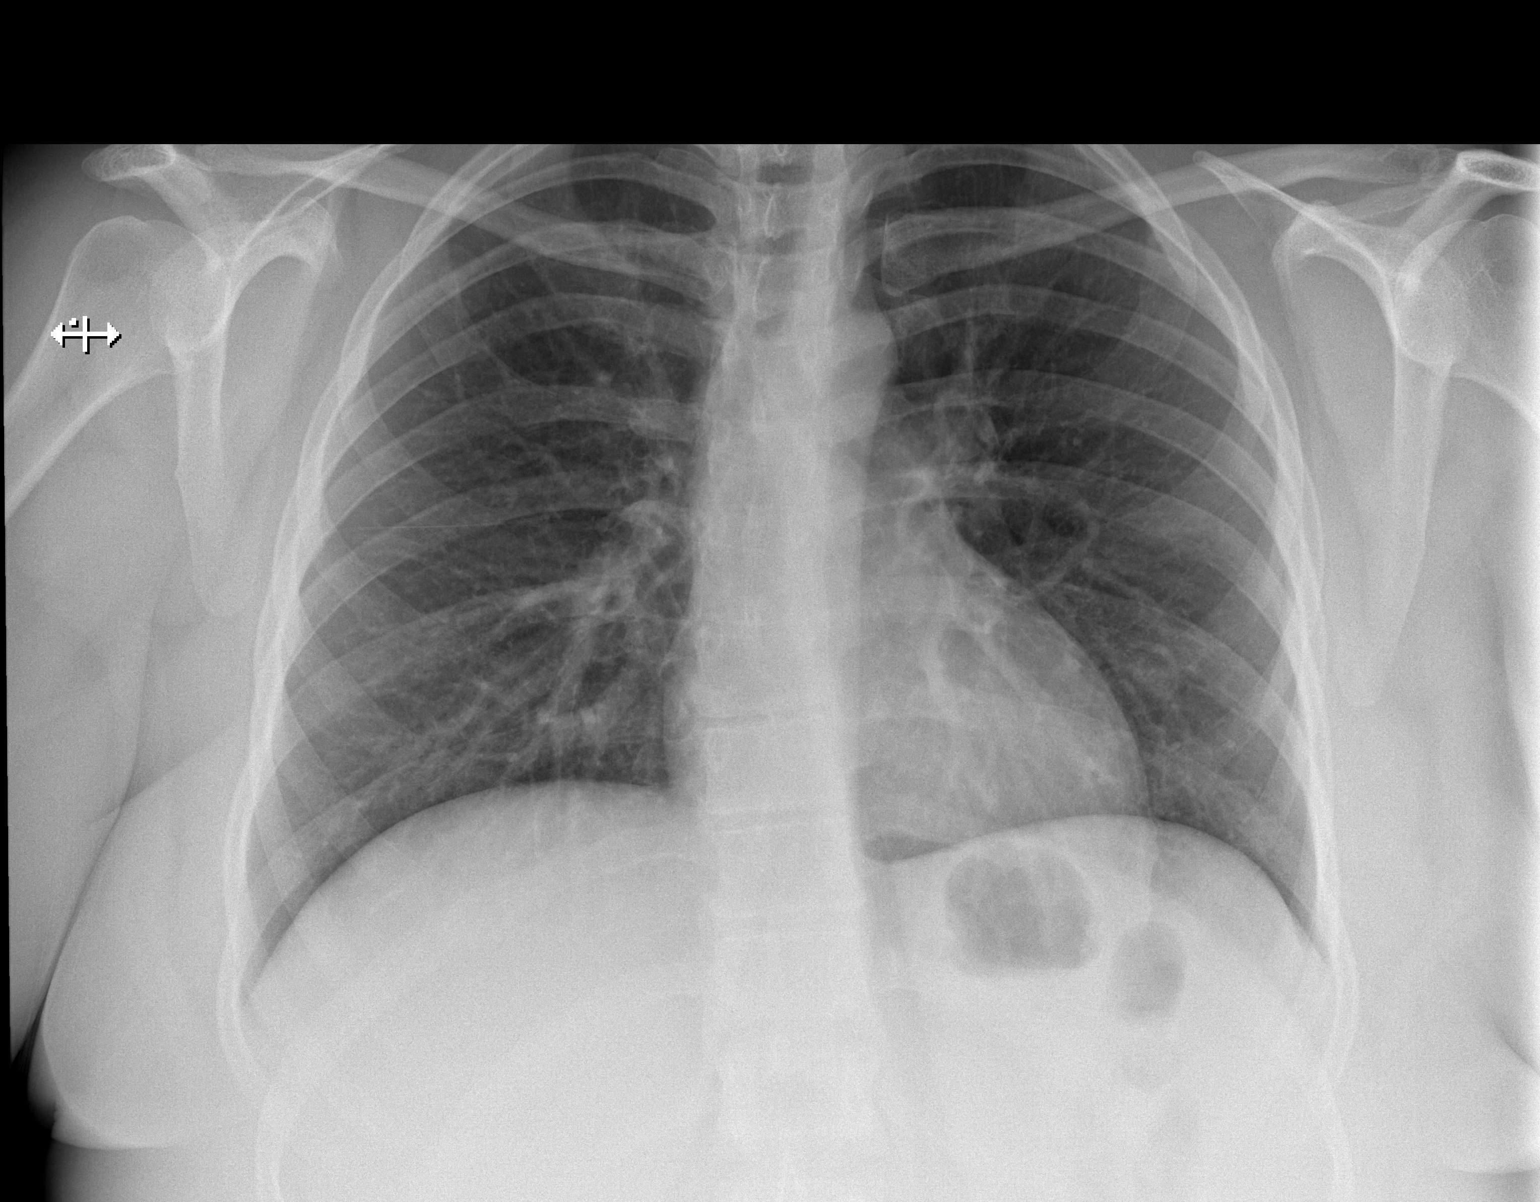

[w chest lat]
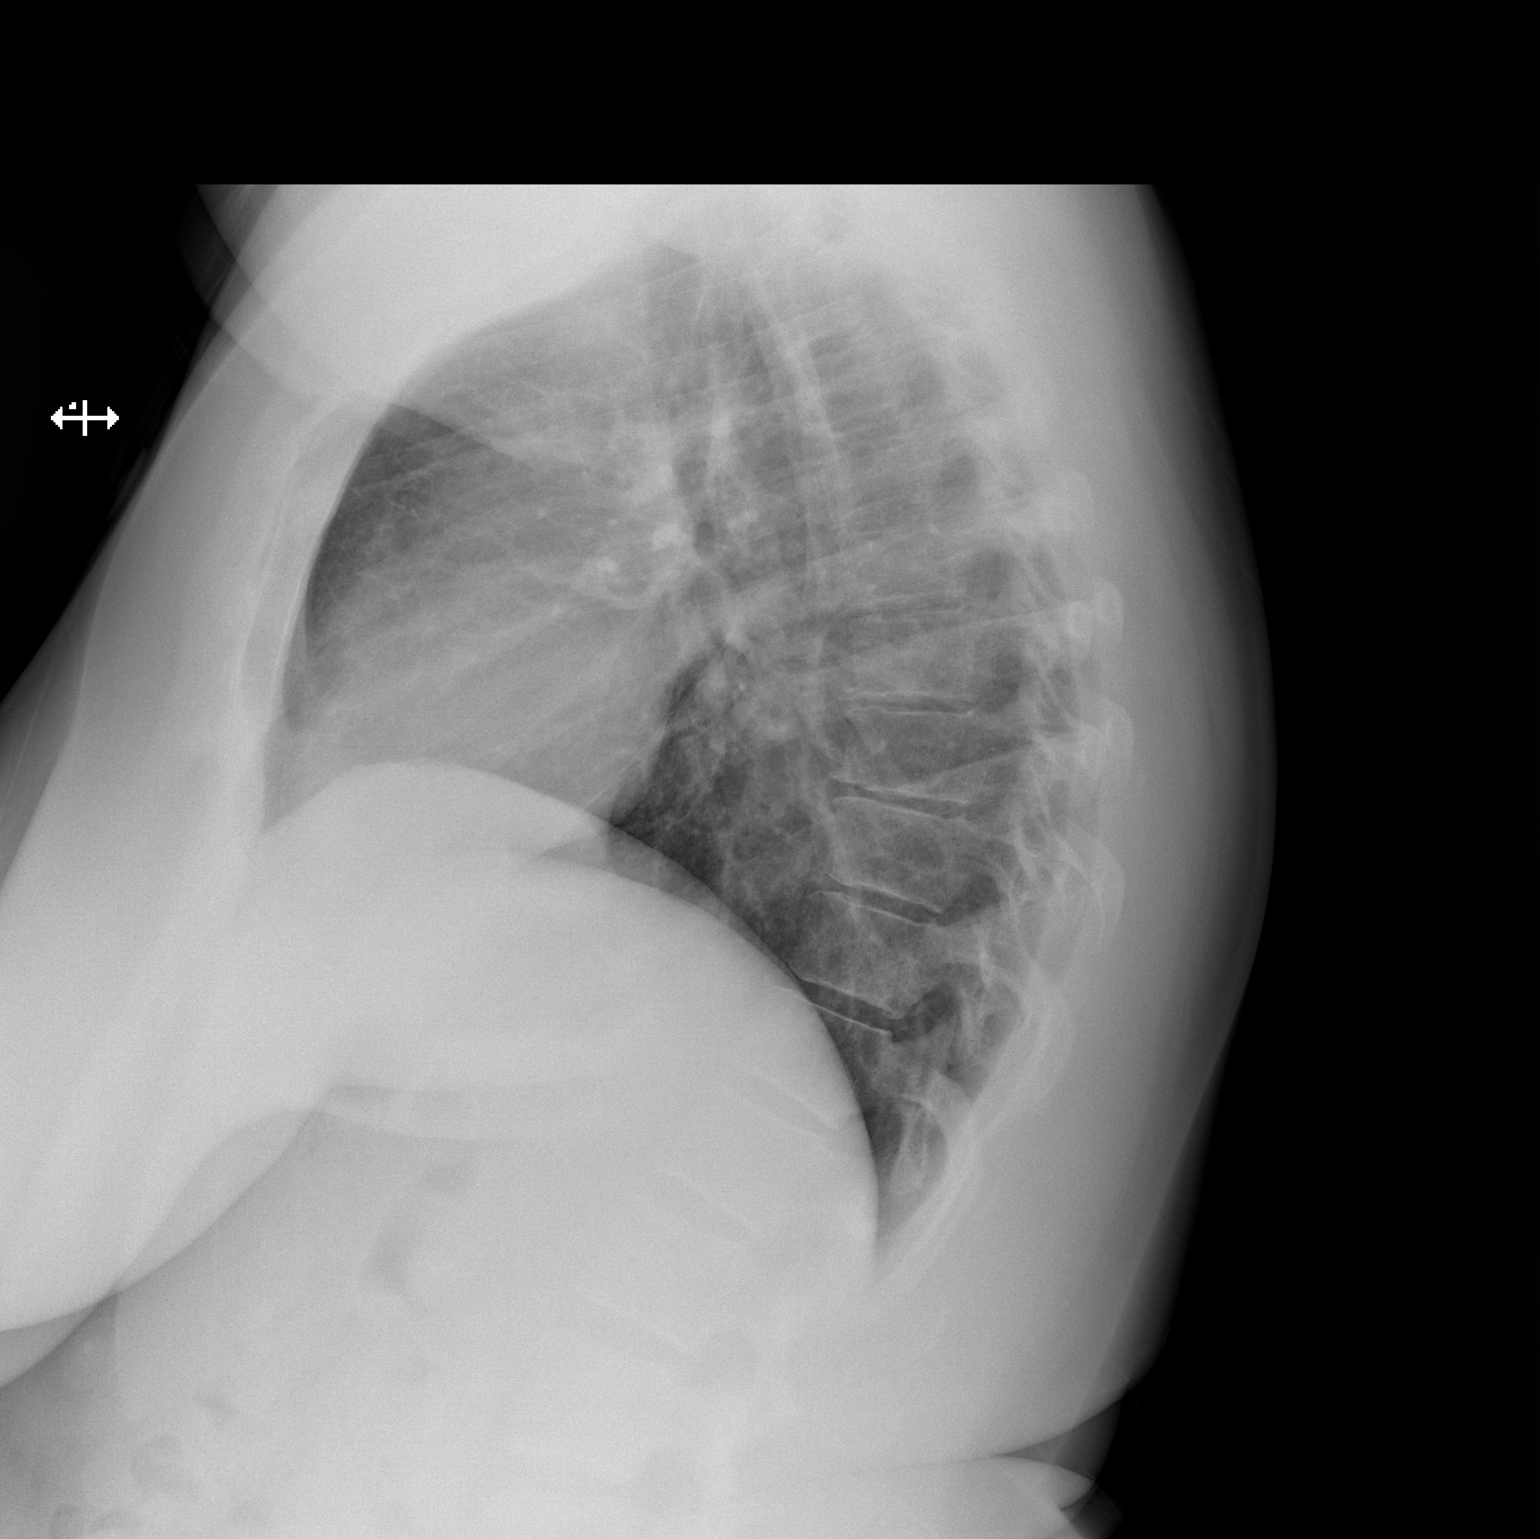

[2 of 2 positions shown; findings below may reference images not displayed]

FINDINGS: Normal heart size, mediastinal contours, and pulmonary vascularity.

Lungs clear.

No pleural effusion or pneumothorax.

Bones unremarkable.
IMPRESSION: Normal exam.

## 2019-06-16 ENCOUNTER — Other Ambulatory Visit (HOSPITAL_BASED_OUTPATIENT_CLINIC_OR_DEPARTMENT_OTHER): Payer: Self-pay

## 2019-06-16 DIAGNOSIS — R5383 Other fatigue: Secondary | ICD-10-CM

## 2019-06-16 DIAGNOSIS — G4710 Hypersomnia, unspecified: Secondary | ICD-10-CM

## 2019-06-16 DIAGNOSIS — R0683 Snoring: Secondary | ICD-10-CM

## 2019-07-09 ENCOUNTER — Other Ambulatory Visit (HOSPITAL_COMMUNITY)
Admission: RE | Admit: 2019-07-09 | Discharge: 2019-07-09 | Disposition: A | Discharge status: Home / Self Care | Payer: BC Managed Care – PPO | Source: Ambulatory Visit | Attending: Internal Medicine | Admitting: Internal Medicine

## 2019-07-09 DIAGNOSIS — Z01812 Encounter for preprocedural laboratory examination: Secondary | ICD-10-CM | POA: Diagnosis present

## 2019-07-09 DIAGNOSIS — Z20822 Contact with and (suspected) exposure to covid-19: Secondary | ICD-10-CM | POA: Insufficient documentation

## 2019-07-09 LAB — SARS CORONAVIRUS 2 (TAT 6-24 HRS): SARS Coronavirus 2: NEGATIVE

## 2019-07-11 ENCOUNTER — Other Ambulatory Visit: Payer: Self-pay

## 2019-07-11 ENCOUNTER — Ambulatory Visit (HOSPITAL_BASED_OUTPATIENT_CLINIC_OR_DEPARTMENT_OTHER): Payer: BC Managed Care – PPO | Attending: Internal Medicine | Admitting: Internal Medicine

## 2019-07-11 VITALS — Ht 66.0 in | Wt 288.0 lb

## 2019-07-11 DIAGNOSIS — R0683 Snoring: Secondary | ICD-10-CM | POA: Diagnosis present

## 2019-07-11 DIAGNOSIS — G4710 Hypersomnia, unspecified: Secondary | ICD-10-CM

## 2019-07-11 DIAGNOSIS — R5383 Other fatigue: Secondary | ICD-10-CM | POA: Diagnosis not present

## 2019-07-11 DIAGNOSIS — R519 Headache, unspecified: Secondary | ICD-10-CM | POA: Diagnosis not present

## 2019-07-11 DIAGNOSIS — G4763 Sleep related bruxism: Secondary | ICD-10-CM | POA: Insufficient documentation

## 2019-07-12 NOTE — Procedures (Signed)
   NAME: Wendy Tapia DATE OF BIRTH:  03-Oct-1985 MEDICAL RECORD NUMBER [id number]  LOCATION: [facility]  PHYSICIAN: Deretha Emory  DATE OF STUDY: 07/11/2019  SLEEP STUDY TYPE: Nocturnal Polysomnogram               REFERRING PHYSICIAN: Deretha Emory, MD  INDICATION FOR STUDY: Discrepant results on home sleep testing: WatchPAT indicated mild OSA; ApneaLink Air indicated no significant sleep apnea.   EPWORTH SLEEPINESS SCORE:  NA HEIGHT: 5\' 6"  (167.6 cm)  WEIGHT: 288 lb (130.6 kg)    Body mass index is 46.48 kg/m.  NECK SIZE: 14 in.  MEDICATIONS Patient self administered medications include: N/A. Medications administered during study include No sleep medicine administered.  SLEEP STUDY TECHNIQUE A multi-channel overnight Polysomnography study was performed. The channels recorded and monitored were central and occipital EEG, electrooculogram (EOG), submentalis EMG (chin), nasal and oral airflow, thoracic and abdominal wall motion, anterior tibialis EMG, snore microphone, electrocardiogram, and a pulse oximetry.  TECHNICAL COMMENTS Comments added by Technician: Patient had bradycardia Comments added by Scorer: N/A  SLEEP ARCHITECTURE The study was initiated at 10:17:07 PM and terminated at 5:03:44 AM. The total recorded time was 406.6 minutes. EEG confirmed total sleep time was 298.3 minutes yielding a sleep efficiency of 73.4%%. Sleep onset after lights out was 46.3 minutes with a REM latency of 58.5 minutes. The patient spent 12.9%% of the night in stage N1 sleep, 55.3%% in stage N2 sleep, 16.1%% in stage N3 and 15.8% in REM. Wake after sleep onset (WASO) was 62.0 minutes. The Arousal Index was 10.7/hour.  RESPIRATORY PARAMETERS There were a total of 3 respiratory disturbances out of which 0 were apneas ( 0 obstructive, 0 mixed, 0 central) and 3 hypopneas. The apnea/hypopnea index (AHI) was 0.6 events/hour. The central sleep apnea index was 0.0  events/hour. The REM AHI was 1.3 events/hour and NREM AHI was 0.5 events/hour. The supine AHI was 2.3 events/hour and the non supine AHI was 0.4 supine during 8.72% of sleep. Respiratory disturbances were associated with oxygen desaturation down to a nadir of 85.0% during sleep. The mean oxygen saturation during the study was 93.6%. The cumulative time under 88% oxygen saturation was 3.7 minutes.  LEG MOVEMENT DATA The total leg movements were 4 with a resulting leg movement index of 0.8/hr . Associated arousal with leg movement index was 0.4/hr.  CARDIAC DATA The underlying cardiac rhythm was most consistent with sinus rhythm. Mean heart rate during sleep was 48.2 bpm. Additional rhythm abnormalities include None.  IMPRESSIONS  - No Significant Obstructive Sleep apnea(OSA)  - No Significant Central Sleep Apnea (CSA)  - No significant periodic leg movements(PLMs) during  sleep.   DIAGNOSIS  - Bruxism  RECOMMENDATIONS  - There is no indication for treatment of sleep  disordered breathing based on this study.   Marland Kitchen Sleep specialist, American Board of Internal Medicine  ELECTRONICALLY SIGNED ON:  07/12/2019, 8:35 PM [facility] PH: [phone]   FX: [phone] ACCREDITED BY THE AMERICAN ACADEMY OF SLEEP MEDICINE

## 2019-08-21 ENCOUNTER — Other Ambulatory Visit (HOSPITAL_COMMUNITY): Payer: BLUE CROSS/BLUE SHIELD

## 2019-08-23 ENCOUNTER — Encounter (HOSPITAL_BASED_OUTPATIENT_CLINIC_OR_DEPARTMENT_OTHER): Payer: BLUE CROSS/BLUE SHIELD | Admitting: Internal Medicine

## 2019-09-04 ENCOUNTER — Telehealth: Payer: Self-pay | Admitting: Diagnostic Neuroimaging

## 2019-09-04 NOTE — Telephone Encounter (Signed)
Fine with me - thanks!!

## 2019-09-04 NOTE — Telephone Encounter (Signed)
Thanks. Ok to switch. _VRP

## 2019-09-04 NOTE — Telephone Encounter (Signed)
A returning pt of Dr. Marjory Lies. She is being referred back to our office for botox for her Migraines.   Can I switch her care from Dr. Marjory Lies to Dr. Lucia Gaskins?  Thank you

## 2019-09-25 HISTORY — PX: BARIATRIC SURGERY: SHX1103

## 2019-10-06 NOTE — Progress Notes (Addendum)
[facility]    Provider:  Dr Lucia Gaskins Requesting Provider: Mila Palmer, MD Primary Care Provider:  Mila Palmer, MD  CC:  migraines  HPI:  Wendy Tapia is a 34 y.o. female here as requested by Mila Palmer, MD for migraines.  Patient has a past medical history of hyperlipidemia, headache, cheery angioma, hypothyroidism, multinodular goiter, impaired fasting glucose, morbid obesity BMI 45-49, generalized anxiety disorder, fatigue, anxiety, obstructive sleep apnea, mention of possible bariatric surgery.  She was referred for Botox for migraines.  I reviewed Dr. Laurine Blazer notes, she is planning on having bariatric surgery, she does have obstructive sleep apnea she was being scheduled for an in lab test, she is not allowed to take NSAIDs anymore because of weight loss surgery, patient asked about Botox for migraines and other treatments for migraines and she was referred to me.  She was seen by neurology in the past, she was taking Excedrin as needed in the past, onset of headaches at the age of 35, unilateral, left-sided, left temporal headache with eye pain associated with throbbing, photophobia phonophobia and nausea, when she was seen she was having migraines or headaches at least half the month, also intermittent sharp shooting pains in the left temporal region.  No aura.  Neurologic examination was normal, MRI and MRA of the head was ordered.  She was started on topiramate and rizatriptan.  I do not see that those tests were completed, I checked our care everywhere system and did not find any other documentation on her headaches or imaging of the brain or blood vessels.  She had gastric bypass just recently. Migraines started as a teenager, she also has pcos, migraines worsening, worse around her period, dehydration makes it worse, unilateral, left-sided behind the eyes, pulsating/pounding/throbbing, light/sound sensitivity, nausea, no vomiting, they can be moderately  severe or severe, dark helps, she clenches her teeth. She has daily headaches due to clenching but that clears up quickly and general headaches will have 5 headache days a month that are mild, 2 migraine days a month.   Reviewed notes, labs and imaging from outside physicians, which showed:  Tried: excedrin, maxlat, [place] - only excedrin works.   Will request prior imaging from pcp, patient states had normal brain imaging  Review of Systems: Patient complains of symptoms per HPI as well as the following symptoms: obesity, migraines. Pertinent negatives and positives per HPI. All others negative.   Social History   Socioeconomic History  . Marital status: Married    Spouse name: Lyda Jester  . Number of children: 0  . Years of education: 58  . Highest education level: Not on file  Occupational History    Comment: HR Assistant, [employer]  Tobacco Use  . Smoking status: Never Smoker  . Smokeless tobacco: Never Used  Substance and Sexual Activity  . Alcohol use: No    Comment: intolerance to alcohol  . Drug use: No  . Sexual activity: Not on file  Other Topics Concern  . Not on file  Social History Narrative   Lives with husband   Caffeine - none at this time due to bariatric surgery   Right handed   Social Determinants of Health   Financial Resource Strain:   . Difficulty of Paying Living Expenses:   Food Insecurity:   . Worried About Programme researcher, broadcasting/film/video in the Last Year:   . Barista in the Last Year:   Transportation Needs:   . Freight forwarder (Medical):   Marland Kitchen  Lack of Transportation (Non-Medical):   Physical Activity:   . Days of Exercise per Week:   . Minutes of Exercise per Session:   Stress:   . Feeling of Stress :   Social Connections:   . Frequency of Communication with Friends and Family:   . Frequency of Social Gatherings with Friends and Family:   . Attends Religious Services:   . Active Member of Clubs or Organizations:   . Attends Theatre manager Meetings:   Marland Kitchen Marital Status:   Intimate Partner Violence:   . Fear of Current or Ex-Partner:   . Emotionally Abused:   Marland Kitchen Physically Abused:   . Sexually Abused:     Family History  Problem Relation Age of Onset  . Thyroid disease Mother   . Migraines Mother   . Aneurysm Father        brain, surgery  . Hypertension Father   . Kidney failure Father   . Cancer Maternal Grandmother        stomach  . Diabetes Maternal Grandmother   . Asthma Brother        childhood   . Diabetes Paternal Grandmother   . Other Paternal Grandmother        eye disease    Past Medical History:  Diagnosis Date  . Headache    migraine  . High blood pressure   . PCOS (polycystic ovarian syndrome)   . Thyroid disease     There are no problems to display for this patient.   Past Surgical History:  Procedure Laterality Date  . BARIATRIC SURGERY  09/25/2019  . BIOPSY THYROID     benign    Current Outpatient Medications  Medication Sig Dispense Refill  . cetirizine (ZYRTEC) 10 MG tablet Take 10 mg by mouth daily as needed for allergies.    Marland Kitchen levothyroxine (SYNTHROID, LEVOTHROID) 50 MCG tablet Take 50 mcg by mouth daily before breakfast.    . pantoprazole (PROTONIX) 40 MG tablet Take by mouth.    Marland Kitchen UNABLE TO FIND Med Name: Bariatric Vitamin    . Inositol-D Chiro-Inositol (OVASITOL PO) Take by mouth.    . ondansetron (ZOFRAN ODT) 4 MG disintegrating tablet Take 1-2 tablets (4-8 mg total) by mouth every 8 (eight) hours as needed for nausea or vomiting. May also take with Maxalt(Rizatriptan) for migraine treatment. 30 tablet 11  . rizatriptan (MAXALT-MLT) 10 MG disintegrating tablet Take 1 tablet (10 mg total) by mouth as needed for migraine. May repeat in 2 hours if needed 9 tablet 11  . Ubrogepant (UBRELVY) 100 MG TABS Take 100 mg by mouth every 2 (two) hours as needed. Maximum 200mg  a day. For migraine treatment. 10 tablet 0   No current facility-administered medications for this  visit.    Allergies as of 10/07/2019 - Review Complete 10/07/2019  Allergen Reaction Noted  . Lactose intolerance (gi)  06/12/2016  . Nsaids  10/07/2019  . Other  06/13/2016  . Latex Rash 06/13/2016    Vitals: BP 111/79 (BP Location: Right Arm, Patient Position: Sitting)   Pulse 61   Temp 97.8 F (36.6 C) Comment: taken at front  Ht 5' 6.5" (1.689 m)   Wt 264 lb (119.7 kg)   LMP 10/02/2019 (Exact Date)   BMI 41.97 kg/m  Last Weight:  Wt Readings from Last 1 Encounters:  10/07/19 264 lb (119.7 kg)   Last Height:   Ht Readings from Last 1 Encounters:  10/07/19 5' 6.5" (1.689 m)  Physical exam: Exam: Gen: NAD, conversant, well nourised, obese, well groomed                     CV: RRR, no MRG. No Carotid Bruits. No peripheral edema, warm, nontender Eyes: Conjunctivae clear without exudates or hemorrhage  Neuro: Detailed Neurologic Exam  Speech:    Speech is normal; fluent and spontaneous with normal comprehension.  Cognition:    The patient is oriented to person, place, and time;     recent and remote memory intact;     language fluent;     normal attention, concentration,     fund of knowledge Cranial Nerves:    The pupils are equal, round, and reactive to light. The fundi are normal and spontaneous venous pulsations are present. Visual fields are full to finger confrontation. Extraocular movements are intact. Trigeminal sensation is intact and the muscles of mastication are normal. The face is symmetric. The palate elevates in the midline. Hearing intact. Voice is normal. Shoulder shrug is normal. The tongue has normal motion without fasciculations.   Coordination:    Normal finger nose    Gait:    Normal native gait  Motor Observation:    No asymmetry, no atrophy, and no involuntary movements noted. Tone:    Normal muscle tone.    Posture:    Posture is normal. normal erect    Strength:    Strength is V/V in the upper and lower limbs.        Sensation: intact to LT     Reflex Exam:  DTR's:    Deep tendon reflexes in the upper and lower extremities are normal bilaterally.   Toes:    The toes are downgoing bilaterally.   Clonus:    Clonus is absent.    Assessment/Plan:  [age] year old patient with episodic migraines, 2 migraine days a month, will focus on acute management. We spoke a lot abut acute management, triptans, the new gpants and other options, we will try triptans and she will give me feedback. If headache bounces back may try amerge as it has a longer 1/2 life, if takes too long to get relief may try Tosymra imitrex nasal spray(she doesn't like needles), also gave her some ubrelvy samples, discussed anti-emetics as migraine treatment and started zofran. We also discussed migraines, hereditary aspects and she is not currently considering children in the near future.   At onset of migraine take Rizatriptan(Maxalt) and Zofran(Ondansetron): Please take at the onset of your headache. If it does not improve the symptoms please repeat in 2 hours. Also gave some Ubrelvy in case maxalt doesn't work you can then take the [name], take one and can repeat in 2 hours Give me feedback via email and we can tweak this per how you respond. No new symptoms or red flags (positional quality, exertional, visioon changes etc) and she has had normal imaging by report, no new imaging required.  Discussed: To prevent or relieve headaches, try the following: Cool Compress. Lie down and place a cool compress on your head.  Avoid headache triggers. If certain foods or odors seem to have triggered your migraines in the past, avoid them. A headache diary might help you identify triggers.  Include physical activity in your daily routine. Try a daily walk or other moderate aerobic exercise.  Manage stress. Find healthy ways to cope with the stressors, such as delegating tasks on your to-do list.  Practice relaxation techniques. Try deep  breathing,  yoga, massage and visualization.  Eat regularly. Eating regularly scheduled meals and maintaining a healthy diet might help prevent headaches. Also, drink plenty of fluids.  Follow a regular sleep schedule. Sleep deprivation might contribute to headaches Consider biofeedback. With this mind-body technique, you learn to control certain bodily functions -- such as muscle tension, heart rate and blood pressure -- to prevent headaches or reduce headache pain.    Proceed to emergency room if you experience new or worsening symptoms or symptoms do not resolve, if you have new neurologic symptoms or if headache is severe, or for any concerning symptom.   Provided education and documentation from American headache Society toolbox including articles on: chronic migraine medication overuse headache, chronic migraines, prevention of migraines, behavioral and other nonpharmacologic treatments for headache.    Meds ordered this encounter  Medications  . rizatriptan (MAXALT-MLT) 10 MG disintegrating tablet    Sig: Take 1 tablet (10 mg total) by mouth as needed for migraine. May repeat in 2 hours if needed    Dispense:  9 tablet    Refill:  11  . ondansetron (ZOFRAN ODT) 4 MG disintegrating tablet    Sig: Take 1-2 tablets (4-8 mg total) by mouth every 8 (eight) hours as needed for nausea or vomiting. May also take with Maxalt(Rizatriptan) for migraine treatment.    Dispense:  30 tablet    Refill:  11  . Ubrogepant (UBRELVY) 100 MG TABS    Sig: Take 100 mg by mouth every 2 (two) hours as needed. Maximum 200mg  a day. For migraine treatment.    Dispense:  10 tablet    Refill:  0    Cc: , MD,    Mila Palmer, MD  [facility] [address] [place], [place]  Phone [phone] Fax [phone]  I spent 60 minutes of face-to-face and non-face-to-face time with patient on the  1. Migraine without aura and without status  migrainosus, not intractable    diagnosis.  This included previsit chart review, lab review, study review, order entry, electronic health record documentation, patient education on the different diagnostic and therapeutic options, counseling and coordination of care, risks and benefits of management, compliance, or risk factor reduction

## 2019-10-07 ENCOUNTER — Ambulatory Visit: Payer: BC Managed Care – PPO | Admitting: Neurology

## 2019-10-07 ENCOUNTER — Encounter: Payer: Self-pay | Admitting: Neurology

## 2019-10-07 ENCOUNTER — Other Ambulatory Visit: Payer: Self-pay

## 2019-10-07 VITALS — BP 111/79 | HR 61 | Temp 97.8°F | Ht 66.5 in | Wt 264.0 lb

## 2019-10-07 DIAGNOSIS — G43009 Migraine without aura, not intractable, without status migrainosus: Secondary | ICD-10-CM | POA: Diagnosis not present

## 2019-10-07 MED ORDER — UBRELVY 100 MG PO TABS: 100.0000 mg | ORAL_TABLET | ORAL | 0 refills | Status: DC | PRN

## 2019-10-07 MED ORDER — RIZATRIPTAN BENZOATE 10 MG PO TBDP: 10.0000 mg | ORAL_TABLET | ORAL | 11 refills | Status: AC | PRN

## 2019-10-07 MED ORDER — ONDANSETRON 4 MG PO TBDP: 4.0000 mg | ORAL_TABLET | Freq: Three times a day (TID) | ORAL | 11 refills | Status: DC | PRN

## 2019-10-07 NOTE — Patient Instructions (Addendum)
At onset of migraine take Rizatriptan(Maxalt) and Zofran(Ondansetron): Please take one tablet at the onset of your headache. If it does not improve the symptoms please repeat.  Also give some Ubrelvy in case maxalt doesn't work you can then take the [name], take one and can repeat in 2 hours  Ubrogepant tablets What is this medicine? UBROGEPANT (ue BROE je pant) is used to treat migraine headaches with or without aura. An aura is a strange feeling or visual disturbance that warns you of an attack. It is not used to prevent migraines. This medicine may be used for other purposes; ask your health care provider or pharmacist if you have questions. COMMON BRAND NAME(S): [name] What should I tell my health care provider before I take this medicine? They need to know if you have any of these conditions:  kidney disease  liver disease  an unusual or allergic reaction to ubrogepant, other medicines, foods, dyes, or preservatives  pregnant or trying to get pregnant  breast-feeding How should I use this medicine? Take this medicine by mouth with a glass of water. Follow the directions on the prescription label. You can take it with or without food. If it upsets your stomach, take it with food. Take your medicine at regular intervals. Do not take it more often than directed. Do not stop taking except on your doctor's advice. Talk to your pediatrician about the use of this medicine in children. Special care may be needed. Overdosage: If you think you have taken too much of this medicine contact a poison control center or emergency room at once. NOTE: This medicine is only for you. Do not share this medicine with others. What if I miss a dose? This does not apply. This medicine is not for regular use. What may interact with this medicine? Do not take this medicine with any of the following medicines:  ceritinib  certain antibiotics like chloramphenicol, clarithromycin, telithromycin  certain  antivirals for HIV like atazanavir, cobicistat, darunavir, delavirdine, fosamprenavir, indinavir, ritonavir  certain medicines for fungal infections like itraconazole, ketoconazole, posaconazole, voriconazole  conivaptan  grapefruit  idelalisib  mifepristone  nefazodone  ribociclib This medicine may also interact with the following medications:  carvedilol  certain medicines for seizures like phenobarbital, phenytoin  ciprofloxacin  cyclosporine  eltrombopag  fluconazole  fluvoxamine  quinidine  rifampin  St. John's wort  verapamil This list may not describe all possible interactions. Give your health care provider a list of all the medicines, herbs, non-prescription drugs, or dietary supplements you use. Also tell them if you smoke, drink alcohol, or use illegal drugs. Some items may interact with your medicine. What should I watch for while using this medicine? Visit your health care professional for regular checks on your progress. Tell your health care professional if your symptoms do not start to get better or if they get worse. Your mouth may get dry. Chewing sugarless gum or sucking hard candy and drinking plenty of water may help. Contact your health care professional if the problem does not go away or is severe. What side effects may I notice from receiving this medicine? Side effects that you should report to your doctor or health care professional as soon as possible:  allergic reactions like skin rash, itching or hives; swelling of the face, lips, or tongue Side effects that usually do not require medical attention (report these to your doctor or health care professional if they continue or are bothersome):  drowsiness  dry mouth  nausea  tiredness This list may not describe all possible side effects. Call your doctor for medical advice about side effects. You may report side effects to FDA at 1-800-FDA-1088. Where should I keep my medicine? Keep  out of the reach of children. Store at room temperature between 15 and 30 degrees C (59 and 86 degrees F). Throw away any unused medicine after the expiration date. NOTE: This sheet is a summary. It may not cover all possible information. If you have questions about this medicine, talk to your doctor, pharmacist, or health care provider.  2020 Elsevier/Gold Standard (2018-08-29 08:50:55) Ondansetron oral dissolving tablet What is this medicine? ONDANSETRON (on DAN se tron) is used to treat nausea and vomiting caused by chemotherapy. It is also used to prevent or treat nausea and vomiting after surgery. This medicine may be used for other purposes; ask your health care provider or pharmacist if you have questions. COMMON BRAND NAME(S): Zofran ODT What should I tell my health care provider before I take this medicine? They need to know if you have any of these conditions:  heart disease  history of irregular heartbeat  liver disease  low levels of magnesium or potassium in the blood  an unusual or allergic reaction to ondansetron, granisetron, other medicines, foods, dyes, or preservatives  pregnant or trying to get pregnant  breast-feeding How should I use this medicine? These tablets are made to dissolve in the mouth. Do not try to push the tablet through the foil backing. With dry hands, peel away the foil backing and gently remove the tablet. Place the tablet in the mouth and allow it to dissolve, then swallow. While you may take these tablets with water, it is not necessary to do so. Talk to your pediatrician regarding the use of this medicine in children. Special care may be needed. Overdosage: If you think you have taken too much of this medicine contact a poison control center or emergency room at once. NOTE: This medicine is only for you. Do not share this medicine with others. What if I miss a dose? If you miss a dose, take it as soon as you can. If it is almost time for your  next dose, take only that dose. Do not take double or extra doses. What may interact with this medicine? Do not take this medicine with any of the following medications:  apomorphine  certain medicines for fungal infections like fluconazole, itraconazole, ketoconazole, posaconazole, voriconazole  cisapride  dronedarone  pimozide  thioridazine This medicine may also interact with the following medications:  carbamazepine  certain medicines for depression, anxiety, or psychotic disturbances  fentanyl  linezolid  MAOIs like Carbex, Eldepryl, Marplan, Nardil, and Parnate  methylene blue (injected into a vein)  other medicines that prolong the QT interval (cause an abnormal heart rhythm) like dofetilide, ziprasidone  phenytoin  rifampicin  tramadol This list may not describe all possible interactions. Give your health care provider a list of all the medicines, herbs, non-prescription drugs, or dietary supplements you use. Also tell them if you smoke, drink alcohol, or use illegal drugs. Some items may interact with your medicine. What should I watch for while using this medicine? Check with your doctor or health care professional as soon as you can if you have any sign of an allergic reaction. What side effects may I notice from receiving this medicine? Side effects that you should report to your doctor or health care professional as soon as possible:  allergic reactions like skin rash,  itching or hives, swelling of the face, lips, or tongue  breathing problems  confusion  dizziness  fast or irregular heartbeat  feeling faint or lightheaded, falls  fever and chills  loss of balance or coordination  seizures  sweating  swelling of the hands and feet  tightness in the chest  tremors  unusually weak or tired Side effects that usually do not require medical attention (report to your doctor or health care professional if they continue or are  bothersome):  constipation or diarrhea  headache This list may not describe all possible side effects. Call your doctor for medical advice about side effects. You may report side effects to FDA at 1-800-FDA-1088. Where should I keep my medicine? Keep out of the reach of children. Store between 2 and 30 degrees C (36 and 86 degrees F). Throw away any unused medicine after the expiration date. NOTE: This sheet is a summary. It may not cover all possible information. If you have questions about this medicine, talk to your doctor, pharmacist, or health care provider.  2020 Elsevier/Gold Standard (2018-06-04 07:14:10)   Rizatriptan tablets What is this medicine? RIZATRIPTAN (rye za TRIP tan) is used to treat migraines with or without aura. An aura is a strange feeling or visual disturbance that warns you of an attack. It is not used to prevent migraines. This medicine may be used for other purposes; ask your health care provider or pharmacist if you have questions. COMMON BRAND NAME(S): Maxalt What should I tell my health care provider before I take this medicine? They need to know if you have any of these conditions:  cigarette smoker  circulation problems in fingers and toes  diabetes  heart disease  high blood pressure  high cholesterol  history of irregular heartbeat  history of stroke  kidney disease  liver disease  stomach or intestine problems  an unusual or allergic reaction to rizatriptan, other medicines, foods, dyes, or preservatives  pregnant or trying to get pregnant  breast-feeding How should I use this medicine? Take this medicine by mouth with a glass of water. Follow the directions on the prescription label. Do not take it more often than directed. Talk to your pediatrician regarding the use of this medicine in children. While this drug may be prescribed for children as young as 6 years for selected conditions, precautions do apply. Overdosage: If you  think you have taken too much of this medicine contact a poison control center or emergency room at once. NOTE: This medicine is only for you. Do not share this medicine with others. What if I miss a dose? This does not apply. This medicine is not for regular use. What may interact with this medicine? Do not take this medicine with any of the following medicines:  certain medicines for migraine headache like almotriptan, eletriptan, frovatriptan, naratriptan, rizatriptan, sumatriptan, zolmitriptan  ergot alkaloids like dihydroergotamine, ergonovine, ergotamine, methylergonovine  MAOIs like Carbex, Eldepryl, Marplan, Nardil, and Parnate This medicine may also interact with the following medications:  certain medicines for depression, anxiety, or psychotic disorders  propranolol This list may not describe all possible interactions. Give your health care provider a list of all the medicines, herbs, non-prescription drugs, or dietary supplements you use. Also tell them if you smoke, drink alcohol, or use illegal drugs. Some items may interact with your medicine. What should I watch for while using this medicine? Visit your healthcare professional for regular checks on your progress. Tell your healthcare professional if your symptoms do  not start to get better or if they get worse. You may get drowsy or dizzy. Do not drive, use machinery, or do anything that needs mental alertness until you know how this medicine affects you. Do not stand up or sit up quickly, especially if you are an older patient. This reduces the risk of dizzy or fainting spells. Alcohol may interfere with the effect of this medicine. Your mouth may get dry. Chewing sugarless gum or sucking hard candy and drinking plenty of water may help. Contact your healthcare professional if the problem does not go away or is severe. If you take migraine medicines for 10 or more days a month, your migraines may get worse. Keep a diary of  headache days and medicine use. Contact your healthcare professional if your migraine attacks occur more frequently. What side effects may I notice from receiving this medicine? Side effects that you should report to your doctor or health care professional as soon as possible:  allergic reactions like skin rash, itching or hives, swelling of the face, lips, or tongue  chest pain or chest tightness  signs and symptoms of a dangerous change in heartbeat or heart rhythm like chest pain; dizziness; fast, irregular heartbeat; palpitations; feeling faint or lightheaded; falls; breathing problems  signs and symptoms of a stroke like changes in vision; confusion; trouble speaking or understanding; severe headaches; sudden numbness or weakness of the face, arm or leg; trouble walking; dizziness; loss of balance or coordination  signs and symptoms of serotonin syndrome like irritable; confusion; diarrhea; fast or irregular heartbeat; muscle twitching; stiff muscles; trouble walking; sweating; high fever; seizures; chills; vomiting Side effects that usually do not require medical attention (report to your doctor or health care professional if they continue or are bothersome):  diarrhea  dizziness  drowsiness  dry mouth  headache  nausea, vomiting  pain, tingling, numbness in the hands or feet  stomach pain This list may not describe all possible side effects. Call your doctor for medical advice about side effects. You may report side effects to FDA at 1-800-FDA-1088. Where should I keep my medicine? Keep out of the reach of children. Store at room temperature between 15 and 30 degrees C (59 and 86 degrees F). Keep container tightly closed. Throw away any unused medicine after the expiration date. NOTE: This sheet is a summary. It may not cover all possible information. If you have questions about this medicine, talk to your doctor, pharmacist, or health care provider.  2020 Elsevier/Gold  Standard (2017-12-25 14:59:59)

## 2019-11-12 DIAGNOSIS — E042 Nontoxic multinodular goiter: Secondary | ICD-10-CM | POA: Diagnosis not present

## 2019-11-12 DIAGNOSIS — E041 Nontoxic single thyroid nodule: Secondary | ICD-10-CM | POA: Diagnosis not present

## 2019-11-12 DIAGNOSIS — E039 Hypothyroidism, unspecified: Secondary | ICD-10-CM | POA: Diagnosis not present

## 2019-11-19 ENCOUNTER — Other Ambulatory Visit: Payer: Self-pay | Admitting: Neurology

## 2019-11-19 MED ORDER — SUMATRIPTAN SUCCINATE 6 MG/0.5ML ~~LOC~~ SOLN: 6.0000 mg | SUBCUTANEOUS | 6 refills | Status: DC | PRN

## 2019-12-04 DIAGNOSIS — Z6836 Body mass index (BMI) 36.0-36.9, adult: Secondary | ICD-10-CM | POA: Diagnosis not present

## 2019-12-04 DIAGNOSIS — Z01419 Encounter for gynecological examination (general) (routine) without abnormal findings: Secondary | ICD-10-CM | POA: Diagnosis not present

## 2019-12-15 ENCOUNTER — Other Ambulatory Visit: Payer: Self-pay | Admitting: Neurology

## 2019-12-15 DIAGNOSIS — G43009 Migraine without aura, not intractable, without status migrainosus: Secondary | ICD-10-CM

## 2019-12-15 MED ORDER — UBRELVY 100 MG PO TABS: 100.0000 mg | ORAL_TABLET | ORAL | 0 refills | Status: DC | PRN

## 2019-12-17 ENCOUNTER — Other Ambulatory Visit: Payer: Self-pay | Admitting: *Deleted

## 2019-12-17 DIAGNOSIS — G43009 Migraine without aura, not intractable, without status migrainosus: Secondary | ICD-10-CM

## 2019-12-17 MED ORDER — UBRELVY 100 MG PO TABS: 100.0000 mg | ORAL_TABLET | ORAL | 0 refills | Status: DC | PRN

## 2019-12-17 NOTE — Telephone Encounter (Signed)
Completed [name] PA on Cover My Meds. Key: [id number]. Awaiting determination.

## 2019-12-31 DIAGNOSIS — Z9884 Bariatric surgery status: Secondary | ICD-10-CM | POA: Diagnosis not present

## 2019-12-31 DIAGNOSIS — Z6834 Body mass index (BMI) 34.0-34.9, adult: Secondary | ICD-10-CM | POA: Diagnosis not present

## 2019-12-31 DIAGNOSIS — E669 Obesity, unspecified: Secondary | ICD-10-CM | POA: Diagnosis not present

## 2019-12-31 DIAGNOSIS — K912 Postsurgical malabsorption, not elsewhere classified: Secondary | ICD-10-CM | POA: Diagnosis not present

## 2019-12-31 DIAGNOSIS — Z48815 Encounter for surgical aftercare following surgery on the digestive system: Secondary | ICD-10-CM | POA: Diagnosis not present

## 2019-12-31 DIAGNOSIS — Z713 Dietary counseling and surveillance: Secondary | ICD-10-CM | POA: Diagnosis not present

## 2019-12-31 NOTE — Telephone Encounter (Signed)
Received Wendy Tapia denial. Requirement for pt to try two different triptan medications first. Pt has only tried Rizatriptan. She can use the savings card. If we choose to appeal, pt must sign [employer] release form and then provider can fax appeal to [phone].

## 2020-01-09 DIAGNOSIS — E288 Other ovarian dysfunction: Secondary | ICD-10-CM | POA: Diagnosis not present

## 2020-01-09 DIAGNOSIS — Z3162 Encounter for fertility preservation counseling: Secondary | ICD-10-CM | POA: Diagnosis not present

## 2020-01-09 DIAGNOSIS — Z3184 Encounter for fertility preservation procedure: Secondary | ICD-10-CM | POA: Diagnosis not present

## 2020-01-13 ENCOUNTER — Other Ambulatory Visit: Payer: Self-pay | Admitting: Neurology

## 2020-01-13 DIAGNOSIS — G43009 Migraine without aura, not intractable, without status migrainosus: Secondary | ICD-10-CM

## 2020-01-15 DIAGNOSIS — Z0000 Encounter for general adult medical examination without abnormal findings: Secondary | ICD-10-CM | POA: Diagnosis not present

## 2020-01-20 DIAGNOSIS — Z1159 Encounter for screening for other viral diseases: Secondary | ICD-10-CM | POA: Diagnosis not present

## 2020-03-26 DIAGNOSIS — Z9884 Bariatric surgery status: Secondary | ICD-10-CM | POA: Diagnosis not present

## 2020-03-26 DIAGNOSIS — K912 Postsurgical malabsorption, not elsewhere classified: Secondary | ICD-10-CM | POA: Diagnosis not present

## 2020-04-07 DIAGNOSIS — K5909 Other constipation: Secondary | ICD-10-CM | POA: Diagnosis not present

## 2020-04-07 DIAGNOSIS — Y838 Other surgical procedures as the cause of abnormal reaction of the patient, or of later complication, without mention of misadventure at the time of the procedure: Secondary | ICD-10-CM | POA: Diagnosis not present

## 2020-04-07 DIAGNOSIS — E669 Obesity, unspecified: Secondary | ICD-10-CM | POA: Diagnosis not present

## 2020-04-07 DIAGNOSIS — Z9884 Bariatric surgery status: Secondary | ICD-10-CM | POA: Diagnosis not present

## 2020-04-07 DIAGNOSIS — Z48815 Encounter for surgical aftercare following surgery on the digestive system: Secondary | ICD-10-CM | POA: Diagnosis not present

## 2020-04-07 DIAGNOSIS — Z713 Dietary counseling and surveillance: Secondary | ICD-10-CM | POA: Diagnosis not present

## 2020-04-07 DIAGNOSIS — Z6831 Body mass index (BMI) 31.0-31.9, adult: Secondary | ICD-10-CM | POA: Diagnosis not present

## 2020-04-07 DIAGNOSIS — Z79899 Other long term (current) drug therapy: Secondary | ICD-10-CM | POA: Diagnosis not present

## 2020-04-07 DIAGNOSIS — K912 Postsurgical malabsorption, not elsewhere classified: Secondary | ICD-10-CM | POA: Diagnosis not present

## 2020-04-07 DIAGNOSIS — Z8349 Family history of other endocrine, nutritional and metabolic diseases: Secondary | ICD-10-CM | POA: Diagnosis not present

## 2020-06-08 DIAGNOSIS — Z113 Encounter for screening for infections with a predominantly sexual mode of transmission: Secondary | ICD-10-CM | POA: Diagnosis not present

## 2020-06-08 DIAGNOSIS — Z114 Encounter for screening for human immunodeficiency virus [HIV]: Secondary | ICD-10-CM | POA: Diagnosis not present

## 2020-06-22 DIAGNOSIS — U071 COVID-19: Secondary | ICD-10-CM | POA: Diagnosis not present

## 2020-06-22 DIAGNOSIS — Z20822 Contact with and (suspected) exposure to covid-19: Secondary | ICD-10-CM | POA: Diagnosis not present

## 2020-07-21 ENCOUNTER — Telehealth: Payer: Self-pay

## 2020-07-21 NOTE — Telephone Encounter (Signed)
I have submitted a PA request for Ubrelvy 100mg  on CMM, Key: [id number].   Received instant approval from [employer].   Effective 07/21/20-10/12/20  Tried meds: Rizatriptan, Zofran, Excedrin, Topamax

## 2020-07-27 DIAGNOSIS — F411 Generalized anxiety disorder: Secondary | ICD-10-CM | POA: Diagnosis not present

## 2020-07-27 DIAGNOSIS — H579 Unspecified disorder of eye and adnexa: Secondary | ICD-10-CM | POA: Diagnosis not present

## 2020-07-27 DIAGNOSIS — H6121 Impacted cerumen, right ear: Secondary | ICD-10-CM | POA: Diagnosis not present

## 2020-10-14 ENCOUNTER — Encounter: Payer: Self-pay | Admitting: *Deleted

## 2021-01-24 ENCOUNTER — Telehealth: Payer: Self-pay | Admitting: Neurology

## 2021-01-24 ENCOUNTER — Other Ambulatory Visit: Payer: Self-pay | Admitting: Neurology

## 2021-01-24 DIAGNOSIS — G43009 Migraine without aura, not intractable, without status migrainosus: Secondary | ICD-10-CM

## 2021-01-24 MED ORDER — UBRELVY 100 MG PO TABS: ORAL_TABLET | ORAL | 0 refills | Status: DC

## 2021-01-24 NOTE — Telephone Encounter (Signed)
Pt request refill UBRELVY 100 MG TABS at [id number]. Schedule appt 05/16/21 at 2 pm

## 2021-01-24 NOTE — Telephone Encounter (Signed)
One refill sent 

## 2021-01-24 NOTE — Telephone Encounter (Signed)
Pt called with current insurance information: [employer] Member ID: [id number] Group ID: [facility] BIN# [id number] PCN# [id number]

## 2021-03-30 ENCOUNTER — Other Ambulatory Visit: Payer: Self-pay | Admitting: Family Medicine

## 2021-03-30 DIAGNOSIS — E042 Nontoxic multinodular goiter: Secondary | ICD-10-CM

## 2021-04-04 ENCOUNTER — Other Ambulatory Visit: Payer: Self-pay

## 2021-04-04 ENCOUNTER — Other Ambulatory Visit: Payer: Self-pay | Admitting: Neurology

## 2021-04-04 DIAGNOSIS — G43009 Migraine without aura, not intractable, without status migrainosus: Secondary | ICD-10-CM

## 2021-04-05 ENCOUNTER — Telehealth: Payer: Self-pay

## 2021-04-05 MED ORDER — UBRELVY 100 MG PO TABS: ORAL_TABLET | ORAL | 0 refills | Status: DC

## 2021-04-05 NOTE — Telephone Encounter (Signed)
Submitted a PA request for Ubrelvy 100mg  on CMM, Key: [id number] - PA Case ID: [id number].   Awaiting determination from [employer].

## 2021-04-05 NOTE — Telephone Encounter (Signed)
The request has been approved. The authorization is effective for a maximum of 6 fills from 04/05/2021 to 10/03/2021, as long as the member is enrolled in their current health plan. The request was approved with a quantity restriction. This has been approved for a quantity limit of 16 with a day supply limit of 30.

## 2021-04-05 NOTE — Telephone Encounter (Signed)
Approval letter received from [employer]. Letter faxed to pharmacy. Received a receipt of confirmation.  

## 2021-04-08 ENCOUNTER — Other Ambulatory Visit: Payer: BC Managed Care – PPO

## 2021-04-14 ENCOUNTER — Other Ambulatory Visit: Payer: Self-pay

## 2021-04-25 ENCOUNTER — Other Ambulatory Visit: Payer: Self-pay

## 2021-05-16 ENCOUNTER — Ambulatory Visit: Payer: BC Managed Care – PPO | Admitting: Neurology

## 2021-05-24 ENCOUNTER — Other Ambulatory Visit: Payer: Self-pay | Admitting: Neurology

## 2021-05-24 DIAGNOSIS — G43009 Migraine without aura, not intractable, without status migrainosus: Secondary | ICD-10-CM

## 2021-06-04 ENCOUNTER — Other Ambulatory Visit: Payer: Self-pay | Admitting: Neurology

## 2021-06-04 DIAGNOSIS — G43009 Migraine without aura, not intractable, without status migrainosus: Secondary | ICD-10-CM

## 2021-06-15 ENCOUNTER — Ambulatory Visit
Admission: RE | Admit: 2021-06-15 | Discharge: 2021-06-15 | Disposition: A | Discharge status: Home / Self Care | Payer: Self-pay | Source: Ambulatory Visit | Attending: Family Medicine | Admitting: Family Medicine

## 2021-06-15 DIAGNOSIS — E042 Nontoxic multinodular goiter: Secondary | ICD-10-CM

## 2021-06-29 ENCOUNTER — Other Ambulatory Visit: Payer: Self-pay

## 2021-07-22 DIAGNOSIS — D2261 Melanocytic nevi of right upper limb, including shoulder: Secondary | ICD-10-CM | POA: Diagnosis not present

## 2021-07-22 DIAGNOSIS — D225 Melanocytic nevi of trunk: Secondary | ICD-10-CM | POA: Diagnosis not present

## 2021-07-22 DIAGNOSIS — D224 Melanocytic nevi of scalp and neck: Secondary | ICD-10-CM | POA: Diagnosis not present

## 2021-07-22 DIAGNOSIS — D2262 Melanocytic nevi of left upper limb, including shoulder: Secondary | ICD-10-CM | POA: Diagnosis not present

## 2021-07-22 DIAGNOSIS — D2239 Melanocytic nevi of other parts of face: Secondary | ICD-10-CM | POA: Diagnosis not present

## 2021-07-28 ENCOUNTER — Telehealth: Payer: Self-pay | Admitting: Neurology

## 2021-07-28 DIAGNOSIS — B338 Other specified viral diseases: Secondary | ICD-10-CM | POA: Diagnosis not present

## 2021-07-28 NOTE — Telephone Encounter (Signed)
I called pt and she has appt Monday VV with Dr. Lucia Gaskins.  She was notifying us with insurance information for that appt.

## 2021-07-28 NOTE — Telephone Encounter (Signed)
Pt called with new insurance information.  [address] [place] ID# [id number] [employer] [phone]

## 2021-08-01 ENCOUNTER — Telehealth: Payer: Self-pay | Admitting: *Deleted

## 2021-08-01 ENCOUNTER — Telehealth (INDEPENDENT_AMBULATORY_CARE_PROVIDER_SITE_OTHER): Payer: Self-pay | Admitting: Neurology

## 2021-08-01 DIAGNOSIS — F458 Other somatoform disorders: Secondary | ICD-10-CM

## 2021-08-01 DIAGNOSIS — G43009 Migraine without aura, not intractable, without status migrainosus: Secondary | ICD-10-CM

## 2021-08-01 DIAGNOSIS — G244 Idiopathic orofacial dystonia: Secondary | ICD-10-CM

## 2021-08-01 MED ORDER — UBRELVY 100 MG PO TABS: 100.0000 mg | ORAL_TABLET | ORAL | 11 refills | Status: DC | PRN

## 2021-08-01 MED ORDER — CYCLOBENZAPRINE HCL 10 MG PO TABS
5.0000 mg | ORAL_TABLET | Freq: Every day | ORAL | 3 refills | Status: DC
Start: 2021-08-01 — End: 2022-09-05

## 2021-08-01 NOTE — Patient Instructions (Signed)
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 She has tried mouth guard. She cannot open her mouth sometime because it hurts so much, she is having difficulty eating, getting temporal headaches. Ongoing since the age of 25. 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 Worsening migraines and temporal headaches and interfering with life. MAY TRY BOTOX if flexeril does not work. Flexeril at bedtime.   - Continue Ubrelvy at onset of migraine. She cannot take NSaids. Can take with Tylenol as needed. She is having 4-14 total total headache days a month (4 migraines included). Migraines are just with period and maybe 4 a month and a few other headaches during the month for a total of 4-14. Failed imitrex and rizatriptan.  Cyclobenzaprine Tablets What is this medication? CYCLOBENZAPRINE (sye kloe BEN za preen) treats muscle spasms. It works by relaxing your muscles, which reduces muscle stiffness. It belongs to a group of medications called muscle relaxants. This medicine may be used for other purposes; ask your health care provider or pharmacist if you have questions. COMMON BRAND NAME(S): Fexmid, Flexeril What should I tell my care team before I take this medication? They need to know if you have any of these conditions: Heart disease, irregular heartbeat, or previous heart attack Liver disease Thyroid problem An unusual or allergic reaction to cyclobenzaprine, tricyclic antidepressants, lactose, other medications, foods, dyes, or preservatives Pregnant or trying to get pregnant Breast-feeding How should I use this medication? Take this medication by mouth with a glass of water. Follow the directions on the prescription label. If this medication upsets your stomach, take it with food or milk. Take your medication at regular intervals. Do not  take it more often than directed. Talk to your care team about the use of this medication in children. Special care may be needed. Overdosage: If you think you have taken too much of this medicine contact a poison control center or emergency room at once. NOTE: This medicine is only for you. Do not share this medicine with others. What if I miss a dose? If you miss a dose, take it as soon as you can. If it is almost time for your next dose, take only that dose. Do not take double or extra doses. What may interact with this medication? Do not take this medication with any of the following: MAOIs like Carbex, Eldepryl, Marplan, Nardil, and Parnate Narcotic medications for cough Safinamide This medication may also interact with the following: Alcohol Bupropion Antihistamines for allergy, cough and cold Certain medications for anxiety or sleep Certain medications for bladder problems like oxybutynin, tolterodine Certain medications for depression like amitriptyline, fluoxetine, sertraline Certain medications for Parkinson's disease like benztropine, trihexyphenidyl Certain medications for seizures like phenobarbital, primidone Certain medications for stomach problems like dicyclomine, hyoscyamine Certain medications for travel sickness like scopolamine General anesthetics like halothane, isoflurane, methoxyflurane, propofol Ipratropium Local anesthetics like lidocaine, pramoxine, tetracaine Medications that relax muscles for surgery Narcotic medications for pain Phenothiazines like chlorpromazine, mesoridazine, prochlorperazine, thioridazine Verapamil This list may not describe all possible interactions. Give your health care provider a list of all the medicines, herbs, non-prescription drugs, or dietary supplements you use. Also tell them if you smoke, drink alcohol, or use illegal drugs. Some items may interact with your medicine. What should I watch for while using this medication? Tell  your care team if your symptoms do  not start to get better or if they get worse. You may get drowsy or dizzy. Do not drive, use machinery, or do anything that needs mental alertness until you know how this medication affects you. Do not stand or sit up quickly, especially if you are an older patient. This reduces the risk of dizzy or fainting spells. Alcohol may interfere with the effect of this medication. Avoid alcoholic drinks. If you are taking another medication that also causes drowsiness, you may have more side effects. Give your care team a list of all medications you use. Your care team will tell you how much medication to take. Do not take more medication than directed. Call emergency for help if you have problems breathing or unusual sleepiness. Your mouth may get dry. Chewing sugarless gum or sucking hard candy, and drinking plenty of water may help. Contact your care team if the problem does not go away or is severe. What side effects may I notice from receiving this medication? Side effects that you should report to your care team as soon as possible: Allergic reactions--skin rash, itching, hives, swelling of the face, lips, tongue, or throat CNS depression--slow or shallow breathing, shortness of breath, feeling faint, dizziness, confusion, trouble staying awake Heart rhythm changes--fast or irregular heartbeat, dizziness, feeling faint or lightheaded, chest pain, trouble breathing Side effects that usually do not require medical attention (report to your care team if they continue or are bothersome): Constipation Dizziness Drowsiness Dry mouth Fatigue Nausea This list may not describe all possible side effects. Call your doctor for medical advice about side effects. You may report side effects to FDA at 1-800-FDA-1088. Where should I keep my medication? Keep out of the reach of children. Store at room temperature between 15 and 30 degrees C (59 and 86 degrees F). Keep container  tightly closed. Throw away any unused medication after the expiration date. NOTE: This sheet is a summary. It may not cover all possible information. If you have questions about this medicine, talk to your doctor, pharmacist, or health care provider.  2022 Elsevier/Gold Standard (2020-09-23 00:00:00)

## 2021-08-01 NOTE — Progress Notes (Signed)
[facility]    Provider:  Dr Lucia GaskinsAhern Requesting Provider: Mila PalmerWolters, Sharon, MD Primary Care Provider:  Mila PalmerWolters, Sharon, MD  CC:  migraines  Interval history: She tried sumatriptan but it never got bad enugh to use it. She has been using ubrelvy. She cannot take NSAIDs anymore. Tylenol and coffee helps. Bernita RaisinUbrelvy helps. She changed insurances. She is having 4-14 total total headache days a month (4 migraines included). Migraines are just with period and maybe 4 a month and a few other headaches during the month for a total of 4-14. Bernita RaisinUbrelvy does not cause rebound, recommend talking it daily during your period week to in a way use an acute medication as a preventative. She is happy with Bernita RaisinUbrelvy, she can manage her migraines. Can take ubrelvy with tylenol. Doesn;t use excedrin because I has nsaids. 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 She has tried mouth guard. She cannot open her mouth sometime because it hurts so much, she is having difficulty eating, getting temporal headaches. Ongoing since the age of 89. Bruxism. Mostly at night.   Patient complains of symptoms per HPI as well as the following symptoms: bruxism . Pertinent negatives and positives per HPI. All others negative   HPI 10/07/2019:  Wendy Tapia is a 36 y.o. female here as requested by Mila PalmerWolters, Sharon, MD for migraines.  Patient has a past medical history of hyperlipidemia, headache, cheery angioma, hypothyroidism, multinodular goiter, impaired fasting glucose, morbid obesity BMI 45-49, generalized anxiety disorder, fatigue, anxiety, obstructive sleep apnea, mention of possible bariatric surgery.  She was referred for Botox for migraines.  I reviewed Dr. Laurine BlazerWalters notes, she is planning on having bariatric surgery, she does have obstructive sleep apnea she was being scheduled for an in lab  test, she is not allowed to take NSAIDs anymore because of weight loss surgery, patient asked about Botox for migraines and other treatments for migraines and she was referred to me.  She was seen by neurology in the past, she was taking Excedrin as needed in the past, onset of headaches at the age of 36, unilateral, left-sided, left temporal headache with eye pain associated with throbbing, photophobia phonophobia and nausea, when she was seen she was having migraines or headaches at least half the month, also intermittent sharp shooting pains in the left temporal region.  No aura.  Neurologic examination was normal, MRI and MRA of the head was ordered.  She was started on topiramate and rizatriptan.  I do not see that those tests were completed, I checked our care everywhere system and did not find any other documentation on her headaches or imaging of the brain or blood vessels.  She had gastric bypass just recently. Migraines started as a teenager, she also has pcos, migraines worsening, worse around her period, dehydration makes it worse, unilateral, left-sided behind the eyes, pulsating/pounding/throbbing, light/sound sensitivity, nausea, no vomiting, they can be moderately severe or severe, dark helps, she clenches her teeth. She has daily headaches due to clenching but that clears up quickly and general headaches will have 5 headache days a month that are mild, 2 migraine days a month.   Reviewed notes, labs and imaging from outside physicians, which showed:  Tried: excedrin, maxlat, ubrelvy, imitrex    Will request prior imaging from pcp, patient states had normal brain imaging  Review of Systems: Patient complains of symptoms per HPI as well as  the following symptoms: obesity, migraines. Pertinent negatives and positives per HPI. All others negative.   Social History   Socioeconomic History   Marital status: Legally Separated    Spouse name: Wendy Tapia   Number of children: 0   Years of  education: 16   Highest education level: Not on file  Occupational History    Comment: HR Assistant, [employer] account planner  Tobacco Use   Smoking status: Never   Smokeless tobacco: Never  Vaping Use   Vaping Use: Never used  Substance and Sexual Activity   Alcohol use: No    Comment: intolerance to alcohol   Drug use: No   Sexual activity: Not on file  Other Topics Concern   Not on file  Social History Narrative   Lives with husband   Caffeine - none at this time due to bariatric surgery   Right handed   Social Determinants of Health   Financial Resource Strain: Not on file  Food Insecurity: Not on file  Transportation Needs: Not on file  Physical Activity: Not on file  Stress: Not on file  Social Connections: Not on file  Intimate Partner Violence: Not on file    Family History  Problem Relation Age of Onset   Thyroid disease Mother    Migraines Mother    Aneurysm Father        brain, surgery   Hypertension Father    Kidney failure Father    Cancer Maternal Grandmother        stomach   Diabetes Maternal Grandmother    Asthma Brother        childhood    Diabetes Paternal Grandmother    Other Paternal Grandmother        eye disease    Past Medical History:  Diagnosis Date   Headache    migraine   High blood pressure    PCOS (polycystic ovarian syndrome)    Thyroid disease     Patient Active Problem List   Diagnosis Date Noted   Bruxism 08/01/2021   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 90 tablet 3   Ubrogepant (UBRELVY) 100 MG TABS Take 100 mg by mouth every 2 (two) hours as needed. Maximum 200mg  a day. 16  tablet 11   cetirizine (ZYRTEC) 10 MG tablet Take 10 mg by mouth daily as needed for allergies.     Inositol-D Chiro-Inositol (OVASITOL PO) Take by mouth.     levothyroxine (SYNTHROID, LEVOTHROID) 50 MCG tablet Take 50 mcg by mouth daily before breakfast.     pantoprazole (PROTONIX) 40 MG tablet Take by mouth.     rizatriptan (MAXALT-MLT) 10 MG disintegrating tablet Take 1 tablet (10 mg total) by mouth as needed for migraine. May repeat in 2 hours if needed 9 tablet 11   SUMAtriptan (IMITREX) 6 MG/0.5ML SOLN injection Inject 0.5 mLs (6 mg total) into the skin every 2 (two) hours as needed for migraine or headache. Take one dose at headache onset, can take additional dose 2hrs later if needed. No more then 2 injections in 24hrs 2.5 mL 6   UNABLE TO FIND Med Name: Bariatric Vitamin  No current facility-administered medications for this visit.    Allergies as of 08/01/2021 - Review Complete 10/07/2019  Allergen Reaction Noted   Lactose intolerance (gi)  06/12/2016   Nsaids  10/07/2019   Other  06/13/2016   Latex Rash 06/13/2016    Vitals: There were no vitals taken for this visit. Last Weight:  Wt Readings from Last 1 Encounters:  10/07/19 264 lb (119.7 kg)   Last Height:   Ht Readings from Last 1 Encounters:  10/07/19 5' 6.5" (1.689 m)     Physical exam: Exam: Gen: NAD, conversant      CV:  Denies palpitations or chest pain or SOB. VS: Breathing at a normal rate. Weight appears within normal limits. Not febrile. Eyes: Conjunctivae clear without exudates or hemorrhage  Neuro: Detailed Neurologic Exam  Speech:    Speech is normal; fluent and spontaneous with normal comprehension.  Cognition:    The patient is oriented to person, place, and time;     recent and remote memory intact;     language fluent;     normal attention, concentration,     fund of knowledge Cranial Nerves:    The pupils are equal, round, and reactive to light. Attempted, Cannot perform fundoscopic  exam. Visual fields are full to finger confrontation. Extraocular movements are intact.  The face is symmetric with normal sensation. The palate elevates in the midline. Hearing intact. Voice is normal. Shoulder shrug is normal. The tongue has normal motion without fasciculations.   Coordination:    Normal   Gait:    Normal   Motor Observation:   no involuntary movements noted. Tone:    Appears normal  Posture:    Posture is normal. normal erect    Strength:    Strength is anti-gravity and symmetric in the upper and lower limbs.      Sensation: intact to LT      Assessment/Plan:  Lovely 36 year old patient with episodic migraines,  We spoke last time a lot abut acute management, triptans, the new gpants and other options,  We also discussed migraines, hereditary aspects and she is not currently considering children in the near future.   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 She has tried mouth guard. She cannot open her mouth sometime because it hurts so much, she is having difficulty eating, getting temporal headaches. Ongoing since the age of 44. 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 Worsening migraines and temporal headaches and interfering with life. MAY TRY BOTOX if flexeril does not work. Flexeril at bedtime.   - Continue Ubrelvy at onset of migraine. She cannot take NSaids. Can take with Tylenol as needed. She is having 4-14 total total headache days a month (4 migraines included). Migraines are just with period and maybe 4 a month and a few other headaches during the month for a total of 4-14. Failed imitrex and rizatriptan.  Meds ordered this encounter  Medications   Ubrogepant (UBRELVY) 100 MG TABS    Sig: Take 100 mg by mouth every 2 (two) hours as needed. Maximum 200mg  a day.    Dispense:  16 tablet     Refill:  11   cyclobenzaprine (FLEXERIL) 10 MG tablet    Sig: Take 0.5-1 tablets (5-10 mg total) by mouth at bedtime.    Dispense:  90 tablet  Refill:  3     Cc: Mila Palmer, MD,    Naomie Dean, MD  [facility] [address] [place], [place] [address]  Phone [phone] Fax [phone]  I spent over 20 minutes of face-to-face and non-face-to-face time with patient on the  1. Migraine without aura and without status migrainosus, not intractable   2. Bruxism   3. Orofacial dystonia    diagnosis.  This included previsit chart review, lab review, study review, order entry, electronic health record documentation, patient education on the different diagnostic and therapeutic options, counseling and coordination of care, risks and benefits of management, compliance, or risk factor reduction

## 2021-08-01 NOTE — Telephone Encounter (Signed)
Adolm Joseph KeyIrena Cords - Rx #: [id number]  PA Vernie Ammons was sent waiting on approval

## 2021-08-03 NOTE — Telephone Encounter (Signed)
Received a fax from [employer] w/ [employer]. Wendy Tapia has been approved under a quantity limit of up to 16 tablets per 30 days from 08/03/21-08/03/22.

## 2021-09-07 NOTE — Telephone Encounter (Signed)
[employer] approved  . Approval effective until 08/03/2022  ?Certification # [id number]  ?

## 2021-09-07 NOTE — Telephone Encounter (Signed)
Received another [place] PA. Completed this on Cover My Meds. Key: [id number]. Awaiting determination from [employer] [place].  ?

## 2022-02-23 DIAGNOSIS — E669 Obesity, unspecified: Secondary | ICD-10-CM | POA: Diagnosis not present

## 2022-02-23 DIAGNOSIS — I7300 Raynaud's syndrome without gangrene: Secondary | ICD-10-CM | POA: Diagnosis not present

## 2022-03-09 DIAGNOSIS — R109 Unspecified abdominal pain: Secondary | ICD-10-CM | POA: Diagnosis not present

## 2022-04-04 DIAGNOSIS — Z9884 Bariatric surgery status: Secondary | ICD-10-CM | POA: Diagnosis not present

## 2022-04-04 DIAGNOSIS — K912 Postsurgical malabsorption, not elsewhere classified: Secondary | ICD-10-CM | POA: Diagnosis not present

## 2022-04-04 DIAGNOSIS — Z713 Dietary counseling and surveillance: Secondary | ICD-10-CM | POA: Diagnosis not present

## 2022-04-06 DIAGNOSIS — Z23 Encounter for immunization: Secondary | ICD-10-CM | POA: Diagnosis not present

## 2022-04-06 DIAGNOSIS — Z0000 Encounter for general adult medical examination without abnormal findings: Secondary | ICD-10-CM | POA: Diagnosis not present

## 2022-04-11 DIAGNOSIS — Z9884 Bariatric surgery status: Secondary | ICD-10-CM | POA: Diagnosis not present

## 2022-04-11 DIAGNOSIS — K912 Postsurgical malabsorption, not elsewhere classified: Secondary | ICD-10-CM | POA: Diagnosis not present

## 2022-04-11 DIAGNOSIS — E785 Hyperlipidemia, unspecified: Secondary | ICD-10-CM | POA: Diagnosis not present

## 2022-04-14 DIAGNOSIS — E669 Obesity, unspecified: Secondary | ICD-10-CM | POA: Diagnosis not present

## 2022-04-14 DIAGNOSIS — Z9884 Bariatric surgery status: Secondary | ICD-10-CM | POA: Diagnosis not present

## 2022-04-14 DIAGNOSIS — Z713 Dietary counseling and surveillance: Secondary | ICD-10-CM | POA: Diagnosis not present

## 2022-04-14 DIAGNOSIS — Z6836 Body mass index (BMI) 36.0-36.9, adult: Secondary | ICD-10-CM | POA: Diagnosis not present

## 2022-04-27 DIAGNOSIS — Z9884 Bariatric surgery status: Secondary | ICD-10-CM | POA: Diagnosis not present

## 2022-04-27 DIAGNOSIS — Z48815 Encounter for surgical aftercare following surgery on the digestive system: Secondary | ICD-10-CM | POA: Diagnosis not present

## 2022-04-27 DIAGNOSIS — Z713 Dietary counseling and surveillance: Secondary | ICD-10-CM | POA: Diagnosis not present

## 2022-04-27 DIAGNOSIS — E88819 Insulin resistance, unspecified: Secondary | ICD-10-CM | POA: Diagnosis not present

## 2022-07-03 DIAGNOSIS — Z03818 Encounter for observation for suspected exposure to other biological agents ruled out: Secondary | ICD-10-CM | POA: Diagnosis not present

## 2022-07-03 DIAGNOSIS — R051 Acute cough: Secondary | ICD-10-CM | POA: Diagnosis not present

## 2022-07-11 DIAGNOSIS — R052 Subacute cough: Secondary | ICD-10-CM | POA: Diagnosis not present

## 2022-07-11 DIAGNOSIS — H9201 Otalgia, right ear: Secondary | ICD-10-CM | POA: Diagnosis not present

## 2022-07-11 DIAGNOSIS — H9221 Otorrhagia, right ear: Secondary | ICD-10-CM | POA: Diagnosis not present

## 2022-07-11 DIAGNOSIS — J988 Other specified respiratory disorders: Secondary | ICD-10-CM | POA: Diagnosis not present

## 2022-07-22 DIAGNOSIS — J018 Other acute sinusitis: Secondary | ICD-10-CM | POA: Diagnosis not present

## 2022-07-22 DIAGNOSIS — R051 Acute cough: Secondary | ICD-10-CM | POA: Diagnosis not present

## 2022-07-27 DIAGNOSIS — H9211 Otorrhea, right ear: Secondary | ICD-10-CM | POA: Diagnosis not present

## 2022-07-27 DIAGNOSIS — H7201 Central perforation of tympanic membrane, right ear: Secondary | ICD-10-CM | POA: Diagnosis not present

## 2022-08-10 DIAGNOSIS — H6501 Acute serous otitis media, right ear: Secondary | ICD-10-CM | POA: Diagnosis not present

## 2022-08-10 DIAGNOSIS — H6983 Other specified disorders of Eustachian tube, bilateral: Secondary | ICD-10-CM | POA: Diagnosis not present

## 2022-08-10 DIAGNOSIS — H9011 Conductive hearing loss, unilateral, right ear, with unrestricted hearing on the contralateral side: Secondary | ICD-10-CM | POA: Diagnosis not present

## 2022-08-10 DIAGNOSIS — J209 Acute bronchitis, unspecified: Secondary | ICD-10-CM | POA: Diagnosis not present

## 2022-08-10 DIAGNOSIS — J018 Other acute sinusitis: Secondary | ICD-10-CM | POA: Diagnosis not present

## 2022-08-11 DIAGNOSIS — D2261 Melanocytic nevi of right upper limb, including shoulder: Secondary | ICD-10-CM | POA: Diagnosis not present

## 2022-08-11 DIAGNOSIS — D2239 Melanocytic nevi of other parts of face: Secondary | ICD-10-CM | POA: Diagnosis not present

## 2022-08-11 DIAGNOSIS — D2262 Melanocytic nevi of left upper limb, including shoulder: Secondary | ICD-10-CM | POA: Diagnosis not present

## 2022-08-11 DIAGNOSIS — D225 Melanocytic nevi of trunk: Secondary | ICD-10-CM | POA: Diagnosis not present

## 2022-09-05 ENCOUNTER — Other Ambulatory Visit: Payer: Self-pay | Admitting: Neurology

## 2022-09-06 ENCOUNTER — Telehealth: Payer: Self-pay | Admitting: Neurology

## 2022-09-06 ENCOUNTER — Ambulatory Visit: Payer: BLUE CROSS/BLUE SHIELD | Admitting: Neurology

## 2022-09-06 ENCOUNTER — Encounter: Payer: Self-pay | Admitting: Neurology

## 2022-09-06 VITALS — BP 122/85 | HR 66 | Ht 66.0 in | Wt 243.2 lb

## 2022-09-06 DIAGNOSIS — Z124 Encounter for screening for malignant neoplasm of cervix: Secondary | ICD-10-CM | POA: Diagnosis not present

## 2022-09-06 DIAGNOSIS — Z01419 Encounter for gynecological examination (general) (routine) without abnormal findings: Secondary | ICD-10-CM | POA: Diagnosis not present

## 2022-09-06 DIAGNOSIS — G244 Idiopathic orofacial dystonia: Secondary | ICD-10-CM | POA: Diagnosis not present

## 2022-09-06 DIAGNOSIS — G43009 Migraine without aura, not intractable, without status migrainosus: Secondary | ICD-10-CM

## 2022-09-06 MED ORDER — ZAVZPRET 10 MG/ACT NA SOLN: 1.0000 | Freq: Every day | NASAL | 0 refills | Status: DC | PRN

## 2022-09-06 MED ORDER — NURTEC 75 MG PO TBDP: 75.0000 mg | ORAL_TABLET | Freq: Every day | ORAL | 0 refills | Status: DC | PRN

## 2022-09-06 MED ORDER — UBRELVY 100 MG PO TABS: 100.0000 mg | ORAL_TABLET | ORAL | 11 refills | Status: AC | PRN

## 2022-09-06 NOTE — Progress Notes (Addendum)
[id number]    Provider:  Dr Jaynee Eagles Requesting Provider: Jonathon Jordan, MD Primary Care Provider:  Jonathon Jordan, MD  CC:  migraines  09/06/2022:  Migraines are cycle related. 4 migraines a month during period. She has tried [place] and that worked but less effective now. Always a day or two before her period. Take ubrelvy with tylenol at [facility]. Instead of Ubrelvy can try Nurtec. Or zavegepant. Another option is take CGRP with tylenol and a triptan.   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  Symptoms started over a year ago with pain in the jaw muscles, limited mouth opening to where she had significantly decreased intake and weight loss, also dysarthria and temporomandibular joint problems due to the symptoms.  They were impairing her daily activities with social embarrassment, inability to work, weight loss and forming a significant impact on the overall quality of life of the patient.  Inability to open her jaw limits the maximal mouth opening and hampers speech, mastication/eating.   Medications tried: Patient's been under the care of neurology, primary care, she has tried physical therapy, massage, chiropractic, analgesics, muscle relaxer such as Flexeril and baclofen which gave her significant side effects such as sedation.  These were tried over a 54-monthperiod without reduction of pain or quality of life or range of motion.  Patient complains of symptoms per HPI as well as the following symptoms: facial stiffness . Pertinent negatives and positives per HPI. All others negative   Interval history 08/01/2021: She tried sumatriptan but it never got bad enugh to use it. She has been using ubrelvy. She cannot take NSAIDs anymore. Tylenol and coffee helps. URoselyn Meierhelps. She changed insurances. She is having 4-14 total total headache days a month (4 migraines included). Migraines are just with period and maybe 4 a month and a few other headaches  during the month for a total of 4-14. URoselyn Meierdoes not cause rebound, recommend talking it daily during your period week to in a way use an acute medication as a preventative. She is happy with URoselyn Meier she can manage her migraines. Can take ubrelvy with tylenol. Doesn;t use excedrin because I has nsaids. 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 She has tried mouth guard. She cannot open her mouth sometime because it hurts so much, she is having difficulty eating, getting temporal headaches. Ongoing since the age of 89 Bruxism. Mostly at night.   Patient complains of symptoms per HPI as well as the following symptoms: bruxism . Pertinent negatives and positives per HPI. All others negative   HPI 10/07/2019:  Wendy Ribordyis a 37y.o. female here as requested by WJonathon Jordan MD for migraines.  Patient has a past medical history of hyperlipidemia, headache, cheery angioma, hypothyroidism, multinodular goiter, impaired fasting glucose, morbid obesity BMI 45-49, generalized anxiety disorder, fatigue, anxiety, obstructive sleep apnea, mention of possible bariatric surgery.  She was referred for Botox for migraines.  I reviewed Dr. WCheron Schaumannnotes, she is planning on having bariatric surgery, she does have obstructive sleep apnea she was being scheduled for an in lab test, she is not allowed to take NSAIDs anymore because of weight loss surgery, patient asked about Botox for migraines and other treatments for migraines and she was referred to me.  She was seen by neurology in the past, she was taking Excedrin as needed in the past, onset  of headaches at the age of 37, unilateral, left-sided, left temporal headache with eye pain associated with throbbing, photophobia phonophobia and nausea, when she was seen she was having migraines or headaches at least half the month, also  intermittent sharp shooting pains in the left temporal region.  No aura.  Neurologic examination was normal, MRI and MRA of the head was ordered.  She was started on topiramate and rizatriptan.  I do not see that those tests were completed, I checked our care everywhere system and did not find any other documentation on her headaches or imaging of the brain or blood vessels.  She had gastric bypass just recently. Migraines started as a teenager, she also has pcos, migraines worsening, worse around her period, dehydration makes it worse, unilateral, left-sided behind the eyes, pulsating/pounding/throbbing, light/sound sensitivity, nausea, no vomiting, they can be moderately severe or severe, dark helps, she clenches her teeth. She has daily headaches due to clenching but that clears up quickly and general headaches will have 5 headache days a month that are mild, 2 migraine days a month.   Reviewed notes, labs and imaging from outside physicians, which showed:  Tried: excedrin, maxalt, ubrelvy, imitrex, nurtec, ubrelvy, tylenol, cannot take nsaids  Will request prior imaging from pcp, patient states had normal brain imaging  Review of Systems: Patient complains of symptoms per HPI as well as the following symptoms: obesity, migraines. Pertinent negatives and positives per HPI. All others negative.   Social History   Socioeconomic History   Marital status: Legally Separated    Spouse name: Wendy Tapia   Number of children: 0   Years of education: 16   Highest education level: Not on file  Occupational History    Comment: HR Assistant, Dance movement psychotherapist  Tobacco Use   Smoking status: Never   Smokeless tobacco: Never  Vaping Use   Vaping Use: Never used  Substance and Sexual Activity   Alcohol use: No    Comment: intolerance to alcohol   Drug use: No   Sexual activity: Not on file  Other Topics Concern   Not on file  Social History Narrative   Lives with husband   Caffeine - none at this time  due to bariatric surgery   Right handed   Social Determinants of Health   Financial Resource Strain: Not on file  Food Insecurity: Not on file  Transportation Needs: Not on file  Physical Activity: Not on file  Stress: Not on file  Social Connections: Not on file  Intimate Partner Violence: Not on file    Family History  Problem Relation Age of Onset   Thyroid disease Mother    Migraines Mother    Aneurysm Father        brain, surgery   Hypertension Father    Kidney failure Father    Cancer Maternal Grandmother        stomach   Diabetes Maternal Grandmother    Asthma Brother        childhood    Diabetes Paternal Grandmother    Other Paternal Grandmother        eye disease    Past Medical History:  Diagnosis Date   Headache    migraine   High blood pressure    PCOS (polycystic ovarian syndrome)    Thyroid disease     Patient Active Problem List   Diagnosis Date Noted   Bruxism 08/01/2021   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     cyclobenzaprine (FLEXERIL) 10 MG tablet TAKE 1/2 TO 1 TABLET (5-10 MG TOTAL) BY MOUTH EVERY DAY AT BEDTIME 30 tablet 0   Inositol-D Chiro-Inositol (OVASITOL PO) Take by mouth.     levothyroxine (SYNTHROID, LEVOTHROID) 50 MCG tablet Take 50 mcg by mouth daily before breakfast.     pantoprazole (PROTONIX) 40 MG tablet Take by mouth.     Rimegepant Sulfate (NURTEC) 75 MG TBDP Take 1 tablet (75 mg total) by mouth daily as needed. For migraines. Take as close to onset of migraine as possible. One daily maximum. 4 tablet 0   rizatriptan (MAXALT-MLT) 10 MG disintegrating tablet Take 1 tablet (10 mg  total) by mouth as needed for migraine. May repeat in 2 hours if needed 9 tablet 11   SUMAtriptan (IMITREX) 6 MG/0.5ML SOLN injection Inject 0.5 mLs (6 mg total) into the skin every 2 (two) hours as needed for migraine or headache. Take one dose at headache onset, can take additional dose 2hrs later if needed. No more then 2 injections in 24hrs 2.5 mL 6   Ubrogepant (UBRELVY) 100 MG TABS Take 1 tablet (100 mg total) by mouth every 2 (two) hours as needed. Maximum '200mg'$  a day. 16 tablet 11   UNABLE TO FIND Med Name: Bariatric Vitamin     Zavegepant HCl (ZAVZPRET) 10 MG/ACT SOLN Place 1 spray into the nose daily as needed. 2 each 0   No current facility-administered medications for this visit.    Allergies as of 09/06/2022 - Review Complete 09/06/2022  Allergen Reaction Noted   Lactose intolerance (gi)  06/12/2016   Nsaids  10/07/2019   Other  06/13/2016   Latex Rash 06/13/2016    Vitals: BP 122/85   Pulse 66   Ht '5\' 6"'$  (1.676 m)   Wt 243 lb 3.2 oz (110.3 kg)   BMI 39.25 kg/m  Last Weight:  Wt Readings from Last 1 Encounters:  09/06/22 243 lb 3.2 oz (110.3 kg)   Last Height:   Ht Readings from Last 1 Encounters:  09/06/22 '5\' 6"'$  (1.676 m)     Physical exam: Exam: Gen: NAD, conversant      CV:  Denies palpitations or chest pain or SOB. VS: Breathing at a normal rate. Weight appears within normal limits. Not febrile. Eyes: Conjunctivae clear without exudates or hemorrhage Jaw: decreased ROM, palpable masseters, TMJ pain  Neuro: Detailed Neurologic Exam  Speech:    Speech is normal; fluent and spontaneous with normal comprehension.  Cognition:    The patient is oriented to person, place, and time;     recent and remote memory intact;     language fluent;     normal attention, concentration,     fund of knowledge Cranial Nerves:    The pupils are equal, round, and reactive to light. Attempted, Cannot perform fundoscopic exam. Visual fields are full to finger  confrontation. Extraocular movements are intact.  The face is symmetric with normal sensation. The palate elevates in the midline. Hearing intact. Voice is normal. Shoulder shrug is normal. The tongue has normal motion without fasciculations.   Coordination:    Normal   Gait:    Normal  Motor Observation:   no involuntary movements noted. Tone:    Appears normal  Posture:    Posture is normal. normal erect    Strength:    Strength is anti-gravity and symmetric in the upper and lower limbs.      Sensation: intact to LT      Assessment/Plan:  Lovely 37 year old patient with episodic migraines,  We spoke last time a lot abut acute management, triptans, the new gpants and other options,  We also discussed migraines, hereditary aspects and she is not currently considering children in the near future.   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  Symptoms started years ago with pain in the jaw muscles, limited mouth opening to where she has decreased intake and weight loss, also dysarthria and temporomandibular joint problems due to the symptoms. Progressing. impairing her daily activities with social embarrassment, inability to work, weight loss and forming a significant impact on the overall quality of life of the patient.  Inability to open her jaw limits the maximal mouth opening and hampers speech, mastication/eating.   Medications tried: Patient's been under the care of neurology, primary care, she has tried physical therapy, massage, chiropractic, analgesics, muscle relaxer such as Flexeril and baclofen which gave her significant side effects such as sedation.  These were tried over a >29-monthperiod without reduction of pain or quality of life or range of motion. she has headaches, she has spoken to her dentist in the past, had mouth guards, her teeth are getting sensitive. She has tried mouth guard. She cannot open her mouth sometime because it hurts so much, she is  having difficulty eating, getting temporal headaches. Ask for 100 units of botox to decrease pain, increase Range of motion and improve quality of life.  Migraines: At onset Migraines 3 options: Ubrelvy + tylenol + caffeine + rizatriptan. Can repeat Ubrelvy and/or rizatriptan in 2 hours. Max twice a day. Nurtec + tylenol + caffeine + rizatriptan. Can repeat rizatriptan in 2 hours. Max twice a day. Zavzpret + tylenol + caffeine + rizatriptan. Can repeat rizatriptan in 2 hours. Max twice a day. 4. Still take flexeril at bedtime  - prescribe ubrelvy and rizatriptan, give samples of nurtec and zavzpret and if you want those you can mychart me - Also U[facility]elyn Meier[facility] Scripts  - Botox today and get approved for "orofacial dystonia"  Meds ordered this encounter  Medications   Ubrogepant (UBRELVY) 100 MG TABS    Sig: Take 1 tablet (100 mg total) by mouth every 2 (two) hours as needed. Maximum '200mg'$  a day.    Dispense:  16 tablet    Refill:  11    Has 5 migraines a month, < 10 total headache days a month, failed imitrex, maxalt   Rimegepant Sulfate (NURTEC) 75 MG TBDP    Sig: Take 1 tablet (75 mg total) by mouth daily as needed. For migraines. Take as close to onset of migraine as possible. One daily maximum.    Dispense:  4 tablet    Refill:  0   Zavegepant HCl (ZAVZPRET) 10 MG/ACT SOLN    Sig: Place 1 spray into the nose daily as needed.    Dispense:  2 each    Refill:  0   Used samples:  Side effects none    reviewed w/ pt the procedure of botulinum toxin, incl side effects - localized weakness, inj site rxn, myalgia and spread from site of injection    Procedure note  EMG: EMG guidance was not used today to inject muscles detailed below but may be used in the future. Aseptic procedure was performed and patient tolerated procedure. Procedure was performed by Dr. Myrla Halsted    A 31 gauge needle was used to inject  Patient tolerated the procedure.      Refractory to oral medications   Adjusted dose today to optimize response    Motrin / tylenol for injections site pain / soreness    REMS precautions handout given to patient    RTC - see instructions for details    25 units botox each side masseters   Facial injections [id number]  Cc: Jonathon Jordan, MD,    Sarina Ill, MD  [facility] [address] [place], [place] [address]  Phone [phone] Fax [phone]  I spent over 60 minutes of face-to-face and non-face-to-face time with patient on the  1. Migraine without aura and without status migrainosus, not intractable   2. Orofacial dystonia     diagnosis.  This included previsit chart review, lab review, study review, order entry, electronic health record documentation, patient education on the different diagnostic and therapeutic options, counseling and coordination of care, risks and benefits of management, compliance, or risk factor reduction

## 2022-09-06 NOTE — Patient Instructions (Addendum)
At onset Migraines 3 options: Ubrelvy + tylenol + caffeine + rizatriptan. Can repeat Ubrelvy and/or rizatriptan in 2 hours. Max twice a day. Nurtec + tylenol + caffeine + rizatriptan. Can repeat rizatriptan in 2 hours. Max twice a day. Zavzpret + tylenol + caffeine + rizatriptan. Can repeat rizatriptan in 2 hours. Max twice a day. 4. 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 ONABOTULINUMTOXINA (o na BOTT you lye num tox in eh) treats severe muscle spasms. It may also be used to prevent migraine headaches. It can treat excessive sweating when other medications do not work well enough. This medicine may be used for other purposes; ask your health care provider or pharmacist if you have questions. COMMON BRAND NAME(S): Botox What should I tell my care team before I take this medication? They need to know if you have any of these conditions: Breathing problems Cerebral palsy spasms Difficulty urinating Heart problems History of surgery where this medication is going to be used Infection at the site where this medication is going to be used Myasthenia gravis or other neurologic disease Nerve or muscle disease Surgery plans Take medications that treat or prevent blood clots Thyroid problems An unusual or allergic reaction to botulinum toxin, albumin, other medications, foods, dyes, or preservatives Pregnant or trying to get pregnant Breast-feeding How should I use this medication? This medication is for injected into a muscle. It is given by your care team in a hospital or clinic setting. A special MedGuide will be given to you before each treatment. Be sure to read this information carefully each time. Talk to your care team about the  use of this medication in children. While this medication may be prescribed for children as young as 2 years for selected conditions, precautions do apply. Overdosage: If you think you have taken too much of this medicine contact a poison control center or emergency room at once. NOTE: This medicine is only for you. Do not share this medicine with others. What if I miss a dose? This does not apply. What may interact with this medication? Aminoglycoside antibiotics, such as gentamicin, neomycin, tobramycin Muscle relaxants Other botulinum toxin injections This list may not describe all possible interactions. Give your health care provider a list of all the medicines, herbs, non-prescription drugs, or dietary supplements you use. Also tell them if you smoke, drink alcohol, or use illegal drugs. Some items may interact with your medicine. What should I watch for while using this medication? Visit your care team for regular check ups. This medication will cause weakness in the muscle where it is injected. Tell your care team if you feel unusually weak in other muscles. Get medical help right away if you have problems with breathing, swallowing, or talking. This medication might make your eyelids droop or make you see blurry or double. If you have weak muscles or trouble seeing do not drive a car, use machinery, or do other dangerous activities. This medication contains albumin from human blood. It may be possible to pass an infection in this medication, but no cases have been reported. Talk to your care team about the risks and benefits of this medication. If your activities have been limited by your condition, go back to your regular routine slowly  after treatment with this medication. What side effects may I notice from receiving this medication? Side effects that you should report to your care team as soon as possible: Allergic reactions--skin rash, itching, hives, swelling of the face, lips,  tongue, or throat Dryness or irritation of the eyes, eye pain, change in vision, sensitivity to light Infection--fever, chills, cough, sore throat, wounds that don't heal, pain or trouble when passing urine, general feeling of discomfort or being unwell Spread of botulinum toxin effects--unusual weakness or fatigue, blurry or double vision, trouble swallowing, hoarseness or trouble speaking, trouble breathing, loss of bladder control Trouble passing urine Side effects that usually do not require medical attention (report these to your care team if they continue or are bothersome): Dry mouth Eyelid drooping Fatigue Headache Pain, redness, or irritation at injection site This list may not describe all possible side effects. Call your doctor for medical advice about side effects. You may report side effects to FDA at 1-800-FDA-1088. Where should I keep my medication? This medication is given in a hospital or clinic and will not be stored at home. NOTE: This sheet is a summary. It may not cover all possible information. If you have questions about this medicine, talk to your doctor, pharmacist, or health care provider.  2023 Elsevier/Gold Standard (2021-06-09 00:00:00) Zavegepant Nasal Spray What is this medication? ZAVEGEPANT (za VE je pant) treats migraines. It works by blocking a substance in the body that causes migraines. It is not used to prevent migraines. This medicine may be used for other purposes; ask your health care provider or pharmacist if you have questions. COMMON BRAND NAME(S): ZAVZPRET What should I tell my care team before I take this medication? They need to know if you have any of these conditions: Kidney disease Liver disease An unusual or allergic reaction to zavegepant, other medications, foods, dyes, or preservatives Pregnant or trying to get pregnant Breast-feeding How should I use this medication? This medication is for use in the nose. Take it as directed on the  prescription label. Do not use it more often than directed. Make sure that you are using your nasal spray correctly. Ask your care team if you have any questions. Talk to your care team about the use of this medication in children. Special care may be needed. Overdosage: If you think you have taken too much of this medicine contact a poison control center or emergency room at once. NOTE: This medicine is only for you. Do not share this medicine with others. What if I miss a dose? This does not apply. This medication is not for regular use. It should only be used as needed. What may interact with this medication? Decongestant nasal sprays This medication may affect how other medications work, and other medications may affect the way this medication works. Talk with your care team about all of the medications you take. They may suggest changes to your treatment plan to lower the risk of side effects and to make sure your medications work as intended. This list may not describe all possible interactions. Give your health care provider a list of all the medicines, herbs, non-prescription drugs, or dietary supplements you use. Also tell them if you smoke, drink alcohol, or use illegal drugs. Some items may interact with your medicine. What should I watch for while using this medication? Visit your care team for regular checks on your progress. Tell your care team if your symptoms do not start to get better or if they get  worse. What side effects may I notice from receiving this medication? Side effects that you should report to your care team as soon as possible: Allergic reactions--skin rash, itching, hives, swelling of the face, lips, tongue, or throat Side effects that usually do not require medical attention (report to your care team if they continue or are bothersome): Change in taste Dryness or irritation inside the nose Nausea Vomiting This list may not describe all possible side effects. Call  your doctor for medical advice about side effects. You may report side effects to FDA at 1-800-FDA-1088. Where should I keep my medication? Keep out of the reach of children and pets. Store at room temperature between 20 and 25 degrees C (68 and 77 degrees F). Do not freeze. Get rid of any unused medication after the expiration date. To get rid of medications that are no longer needed or have expired: Take the medication to a medication take-back program. Check with your pharmacy or law enforcement to find a location. If you cannot return the medication, ask your pharmacist or care team how to get rid of this medication safely. NOTE: This sheet is a summary. It may not cover all possible information. If you have questions about this medicine, talk to your doctor, pharmacist, or health care provider.  2023 Elsevier/Gold Standard (2021-09-07 00:00:00) Rizatriptan Disintegrating Tablets What is this medication? RIZATRIPTAN (rye za TRIP tan) treats migraines. It works by blocking pain signals and narrowing blood vessels in the brain. It belongs to a group of medications called triptans. It is not used to prevent migraines. This medicine may be used for other purposes; ask your health care provider or pharmacist if you have questions. COMMON BRAND NAME(S): Maxalt-MLT What should I tell my care team before I take this medication? They need to know if you have any of these conditions: Circulation problems in fingers and toes Diabetes Heart disease High blood pressure High cholesterol History of irregular heartbeat History of stroke Stomach or intestine problems Tobacco use An unusual or allergic reaction to rizatriptan, other medications, foods, dyes, or preservatives Pregnant or trying to get pregnant Breast-feeding How should I use this medication? Take this medication by mouth. Take it as directed on the prescription label. You do not need water to take this medication. Leave the tablet in  the sealed pack until you are ready to take it. With dry hands, open the pack and gently remove the tablet. If the tablet breaks or crumbles, throw it away. Use a new tablet. Place the tablet on the tongue and allow it to dissolve. Then, swallow it. Do not cut, crush, or chew this medication. Do not use it more often than directed. Talk to your care team about the use of this medication in children. While it may be prescribed for children as young as 6 years for selected conditions, precautions do apply. Overdosage: If you think you have taken too much of this medicine contact a poison control center or emergency room at once. NOTE: This medicine is only for you. Do not share this medicine with others. What if I miss a dose? This does not apply. This medication is not for regular use. What may interact with this medication? Do not take this medication with any of the following: Ergot alkaloids, such as dihydroergotamine, ergotamine MAOIs, such as Marplan, Nardil, Parnate Other medications for migraine headache, such as almotriptan, eletriptan, frovatriptan, naratriptan, sumatriptan, zolmitriptan This medication may also interact with the following: Certain medications for depression, anxiety, or other  mental health conditions Propranolol This list may not describe all possible interactions. Give your health care provider a list of all the medicines, herbs, non-prescription drugs, or dietary supplements you use. Also tell them if you smoke, drink alcohol, or use illegal drugs. Some items may interact with your medicine. What should I watch for while using this medication? Visit your care team for regular checks on your progress. Tell your care team if your symptoms do not start to get better or if they get worse. This medication may affect your coordination, reaction time, or judgment. Do not drive or operate machinery until you know how this medication affects you. Sit up or stand slowly to reduce  the risk of dizzy or fainting spells. If you take migraine medications for 10 or more days a month, your migraines may get worse. Keep a diary of headache days and medication use. Contact your care team if your migraine attacks occur more frequently. What side effects may I notice from receiving this medication? Side effects that you should report to your care team as soon as possible: Allergic reactions--skin rash, itching, hives, swelling of the face, lips, tongue, or throat Burning, pain, tingling, or color changes in the hands, arms, legs, or feet Heart attack--pain or tightness in the chest, shoulders, arms, or jaw, nausea, shortness of breath, cold or clammy skin, feeling faint or lightheaded Heart rhythm changes--fast or irregular heartbeat, dizziness, feeling faint or lightheaded, chest pain, trouble breathing Increase in blood pressure Irritability, confusion, fast or irregular heartbeat, muscle stiffness, twitching muscles, sweating, high fever, seizure, chills, vomiting, diarrhea, which may be signs of serotonin syndrome Raynaud syndrome--cool, numb, or painful fingers or toes that may change color from pale, to blue, to red Seizures Stroke--sudden numbness or weakness of the face, arm, or leg, trouble speaking, confusion, trouble walking, loss of balance or coordination, dizziness, severe headache, change in vision Sudden or severe stomach pain, bloody diarrhea, fever, nausea, vomiting Vision loss Side effects that usually do not require medical attention (report to your care team if they continue or are bothersome): Dizziness Unusual weakness or fatigue This list may not describe all possible side effects. Call your doctor for medical advice about side effects. You may report side effects to FDA at 1-800-FDA-1088. Where should I keep my medication? Keep out of the reach of children and pets. Store at room temperature between 15 and 30 degrees C (59 and 86 degrees F). Protect from  light and moisture. Get rid of any unused medication after the expiration date. To get rid of medications that are no longer needed or have expired: Take the medication to a medication take-back program. Check with your pharmacy or law enforcement to find a location. If you cannot return the medication, check the label or package insert to see if the medication should be thrown out in the garbage or flushed down the toilet. If you are not sure, ask your care team. If it is safe to put it in the trash, empty the medication out of the container. Mix the medication with cat litter, dirt, coffee grounds, or other unwanted substance. Seal the mixture in a bag or container. Put it in the trash. NOTE: This sheet is a summary. It may not cover all possible information. If you have questions about this medicine, talk to your doctor, pharmacist, or health care provider.  2023 Elsevier/Gold Standard (2021-10-13 00:00:00) Rimegepant Disintegrating Tablets What is this medication? RIMEGEPANT (ri ME je pant) prevents and treats migraines. It works  by blocking a substance in the body that causes migraines. This medicine may be used for other purposes; ask your health care provider or pharmacist if you have questions. COMMON BRAND NAME(S): NURTEC ODT What should I tell my care team before I take this medication? They need to know if you have any of these conditions: Kidney disease Liver disease An unusual or allergic reaction to rimegepant, other medications, foods, dyes, or preservatives Pregnant or trying to get pregnant Breast-feeding How should I use this medication? Take this medication by mouth. Take it as directed on the prescription label. Leave the tablet in the sealed pack until you are ready to take it. With dry hands, open the pack and gently remove the tablet. If the tablet breaks or crumbles, throw it away. Use a new tablet. Place the tablet in the mouth and allow it to dissolve. Then, swallow it.  Do not cut, crush, or chew this medication. You do not need water to take this medication. Talk to your care team about the use of this medication in children. Special care may be needed. Overdosage: If you think you have taken too much of this medicine contact a poison control center or emergency room at once. NOTE: This medicine is only for you. Do not share this medicine with others. What if I miss a dose? This does not apply. This medication is not for regular use. What may interact with this medication? Certain medications for fungal infections, such as fluconazole, itraconazole Rifampin This list may not describe all possible interactions. Give your health care provider a list of all the medicines, herbs, non-prescription drugs, or dietary supplements you use. Also tell them if you smoke, drink alcohol, or use illegal drugs. Some items may interact with your medicine. What should I watch for while using this medication? Visit your care team for regular checks on your progress. Tell your care team if your symptoms do not start to get better or if they get worse. What side effects may I notice from receiving this medication? Side effects that you should report to your care team as soon as possible: Allergic reactions--skin rash, itching, hives, swelling of the face, lips, tongue, or throat Side effects that usually do not require medical attention (report to your care team if they continue or are bothersome): Nausea Stomach pain This list may not describe all possible side effects. Call your doctor for medical advice about side effects. You may report side effects to FDA at 1-800-FDA-1088. Where should I keep my medication? Keep out of the reach of children and pets. Store at room temperature between 20 and 25 degrees C (68 and 77 degrees F). Get rid of any unused medication after the expiration date. To get rid of medications that are no longer needed or have expired: Take the medication  to a medication take-back program. Check with your pharmacy or law enforcement to find a location. If you cannot return the medication, check the label or package insert to see if the medication should be thrown out in the garbage or flushed down the toilet. If you are not sure, ask your care team. If it is safe to put it in the trash, take the medication out of the container. Mix the medication with cat litter, dirt, coffee grounds, or other unwanted substance. Seal the mixture in a bag or container. Put it in the trash. NOTE: This sheet is a summary. It may not cover all possible information. If you have questions about this  medicine, talk to your doctor, pharmacist, or health care provider.  2023 Elsevier/Gold Standard (2021-08-03 00:00:00) Ubrogepant Tablets What is this medication? UBROGEPANT (ue BROE je pant) treats migraines. It works by blocking a substance in the body that causes migraines. It is not used to prevent migraines. This medicine may be used for other purposes; ask your health care provider or pharmacist if you have questions. COMMON BRAND NAME(S): Roselyn Meier What should I tell my care team before I take this medication? They need to know if you have any of these conditions: Kidney disease Liver disease An unusual or allergic reaction to ubrogepant, other medications, foods, dyes, or preservatives Pregnant or trying to get pregnant Breast-feeding How should I use this medication? Take this medication by mouth with a glass of water. Take it as directed on the prescription label. You can take it with or without food. If it upsets your stomach, take it with food. Keep taking it unless your care team tells you to stop. Talk to your care team about the use of this medication in children. Special care may be needed. Overdosage: If you think you have taken too much of this medicine contact a poison control center or emergency room at once. NOTE: This medicine is only for you. Do not  share this medicine with others. What if I miss a dose? This does not apply. This medication is not for regular use. What may interact with this medication? Do not take this medication with any of the following: Adagrasib Ceritinib Certain antibiotics, such as chloramphenicol, clarithromycin, telithromycin Certain antivirals for HIV, such as atazanavir, cobicistat, darunavir, delavirdine, fosamprenavir, indinavir, ritonavir Certain medications for fungal infections, such as itraconazole, ketoconazole, posaconazole, voriconazole Conivaptan Grapefruit Idelalisib Mifepristone Nefazodone Ribociclib This medication may also interact with the following: Carvedilol Certain medications for seizures, such as phenobarbital, phenytoin Ciprofloxacin Cyclosporine Eltrombopag Fluconazole Fluvoxamine Quinidine Rifampin St. John's wort Verapamil This list may not describe all possible interactions. Give your health care provider a list of all the medicines, herbs, non-prescription drugs, or dietary supplements you use. Also tell them if you smoke, drink alcohol, or use illegal drugs. Some items may interact with your medicine. What should I watch for while using this medication? Visit your care team for regular checks on your progress. Tell your care team if your symptoms do not start to get better or if they get worse. Your mouth may get dry. Chewing sugarless gum or sucking hard candy and drinking plenty of water may help. Contact your care team if the problem does not go away or is severe. What side effects may I notice from receiving this medication? Side effects that you should report to your care team as soon as possible: Allergic reactions--skin rash, itching, hives, swelling of the face, lips, tongue, or throat Side effects that usually do not require medical attention (report to your care team if they continue or are bothersome): Drowsiness Dry mouth Fatigue Nausea This list may not  describe all possible side effects. Call your doctor for medical advice about side effects. You may report side effects to FDA at 1-800-FDA-1088. Where should I keep my medication? Keep out of the reach of children and pets. Store between 15 and 30 degrees C (59 and 86 degrees F). Get rid of any unused medication after the expiration date. To get rid of medications that are no longer needed or have expired: Take the medication to a medication take-back program. Check with your pharmacy or law enforcement to find a location.  If you cannot return the medication, check the label or package insert to see if the medication should be thrown out in the garbage or flushed down the toilet. If you are not sure, ask your care team. If it is safe to put it in the trash, pour the medication out of the container. Mix the medication with cat litter, dirt, coffee grounds, or other unwanted substance. Seal the mixture in a bag or container. Put it in the trash. NOTE: This sheet is a summary. It may not cover all possible information. If you have questions about this medicine, talk to your doctor, pharmacist, or health care provider.  2023 Elsevier/Gold Standard (2021-07-21 00:00:00)

## 2022-09-06 NOTE — Telephone Encounter (Signed)
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  Symptoms started years ago with pain in the jaw muscles, limited mouth opening to where she has decreased intake and weight loss, also dysarthria and temporomandibular joint problems due to the symptoms. Progressing. impairing her daily activities with social embarrassment, inability to work, weight loss and forming a significant impact on the overall quality of life of the patient.  Inability to open her jaw limits the maximal mouth opening and hampers speech, mastication/eating.   Medications tried: Patient's been under the care of neurology, primary care, she has tried physical therapy, massage, chiropractic, analgesics, muscle relaxer such as Flexeril and baclofen which gave her significant side effects such as sedation.  These were tried over a >47-monthperiod without reduction of pain or quality of life or range of motion. she has headaches, she has spoken to her dentist in the past, had mouth guards, her teeth are getting sensitive. She has tried mouth guard. She cannot open her mouth sometime because it hurts so much, she is having difficulty eating, getting temporal headaches. Ask for 100 units of botox to decrease pain, increase Range of motion and improve quality of life.

## 2022-09-06 NOTE — Telephone Encounter (Signed)
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

## 2022-09-12 DIAGNOSIS — J018 Other acute sinusitis: Secondary | ICD-10-CM | POA: Diagnosis not present

## 2022-09-12 DIAGNOSIS — H6983 Other specified disorders of Eustachian tube, bilateral: Secondary | ICD-10-CM | POA: Diagnosis not present

## 2022-09-12 DIAGNOSIS — H6501 Acute serous otitis media, right ear: Secondary | ICD-10-CM | POA: Diagnosis not present

## 2022-09-13 ENCOUNTER — Other Ambulatory Visit (HOSPITAL_COMMUNITY): Payer: Self-pay

## 2022-09-13 NOTE — Telephone Encounter (Signed)
   Benefit Verification [id number] Submitted!

## 2022-09-14 ENCOUNTER — Other Ambulatory Visit (HOSPITAL_COMMUNITY): Payer: Self-pay

## 2022-09-14 NOTE — Telephone Encounter (Signed)
Roselyn Meier PA received from [facility] KEY: [id number]

## 2022-09-18 ENCOUNTER — Other Ambulatory Visit (HOSPITAL_COMMUNITY): Payer: Self-pay

## 2022-09-18 NOTE — Telephone Encounter (Addendum)
I have tried to submit PA on CMM several times and it will not push through after clinical questions-will outreach [employer] to see if I can complete the PA over the phone.    Submitted PA Form for Wendy Tapia and faxed along with clinical notes to [phone].  Will update once we receive a response.

## 2022-09-18 NOTE — Telephone Encounter (Signed)
I called [employer] [place] to verify if PA is needed for Jcode: [id number] and CPT: [phone]. Per insurance No PA is required. This will be [place] and [employer]. Reference number for phone call is [id number]

## 2022-09-19 NOTE — Telephone Encounter (Signed)
Pharmacy Patient Advocate Encounter  Received notification from [employer] that the request for prior authorization for Wendy Tapia has been denied due to See below.        Please be advised we currently do not have a Pharmacist to review denials, therefore you will need to process appeals accordingly as needed. Thanks for your support at this time.   Denial letter has been scanned into the chart.

## 2022-09-19 NOTE — Telephone Encounter (Signed)
10 weeks please. Has to be done by me thanks

## 2022-09-20 ENCOUNTER — Encounter: Payer: Self-pay | Admitting: Neurology

## 2022-09-20 NOTE — Telephone Encounter (Signed)
Reached out to [employer] asking about this, not sure who has been releasing phone # to pt.

## 2022-09-20 NOTE — Telephone Encounter (Signed)
Scheduled appointment for 11/15/22, 10 weeks out from last injection date of 09/06/22.

## 2022-09-20 NOTE — Telephone Encounter (Signed)
Noted. I didn't release any contact information to the patient.

## 2022-09-20 NOTE — Telephone Encounter (Signed)
I received a telephone call today from the patient asking what to expect financially for her upcoming appointment in May-I am not sure who released my telephone information to this PT-The prior authorization team doesn't outreach or discuss financial information with the patients. I have uploaded all needed information in the chart note-the Botox one report tells what the PT responsibility will be-if the office can not assist the patient in answering this question it would be best to redirect the PT to call their insurance and discuss.

## 2022-09-20 NOTE — Telephone Encounter (Signed)
Noted. I only have her fax #.

## 2022-10-18 DIAGNOSIS — H6983 Other specified disorders of Eustachian tube, bilateral: Secondary | ICD-10-CM | POA: Diagnosis not present

## 2022-10-18 DIAGNOSIS — H66001 Acute suppurative otitis media without spontaneous rupture of ear drum, right ear: Secondary | ICD-10-CM | POA: Diagnosis not present

## 2022-10-18 DIAGNOSIS — H60331 Swimmer's ear, right ear: Secondary | ICD-10-CM | POA: Diagnosis not present

## 2022-10-31 ENCOUNTER — Encounter: Payer: Self-pay | Admitting: Neurology

## 2022-10-31 IMAGING — US US THYROID
1 series · 13 of 25 positions shown · non-contrast
Comparison: 08/22/2016

08/16/2016

12/22/2014

11/06/2014

CLINICAL DATA: Goiter.

EXAM:
THYROID ULTRASOUND
TECHNIQUE: Ultrasound examination of the thyroid gland and adjacent soft
tissues was performed.

[Series 1: us thyroid · 0.04mm/px · 13 of 33 slices shown]
[im 1/33]
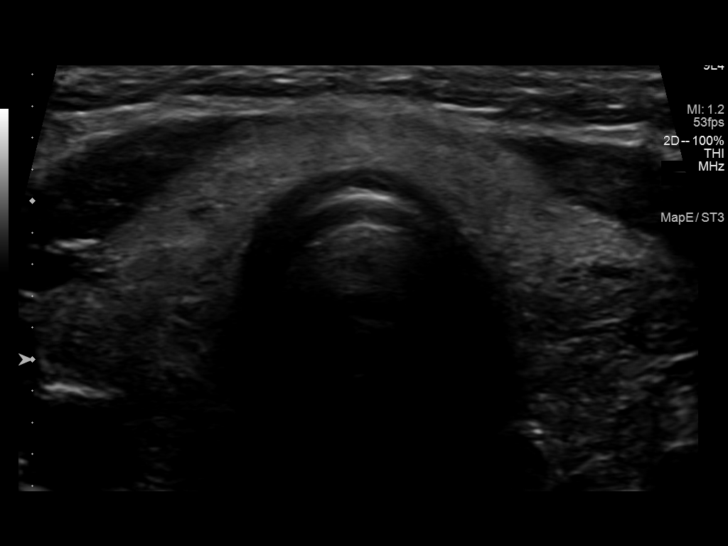
[im 3/33]
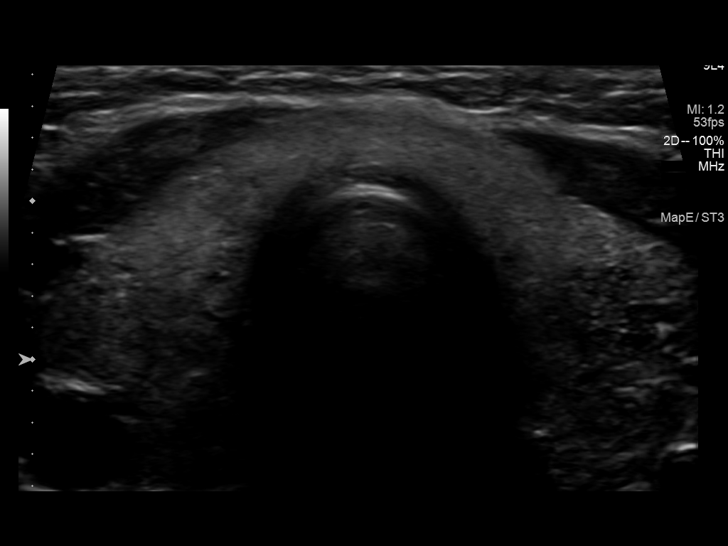
[im 6/33]
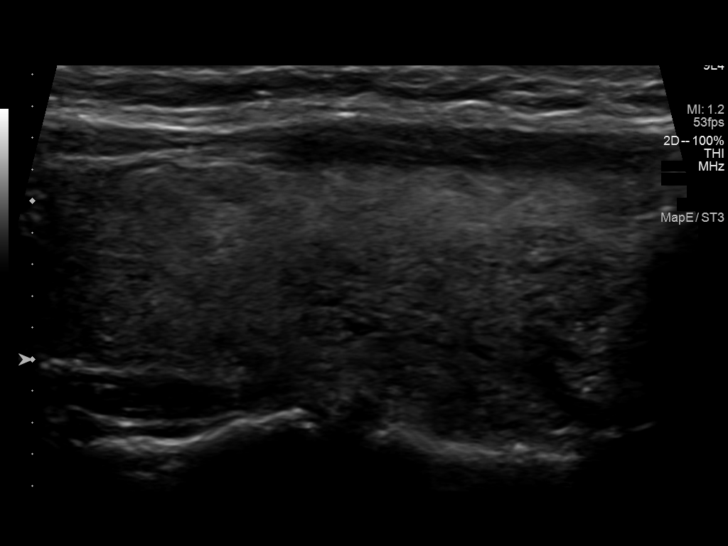
[im 9/33]
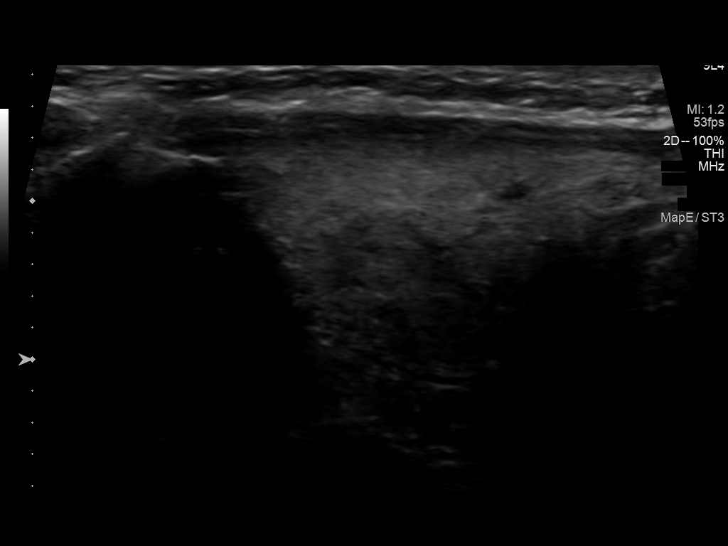
[im 11/33]
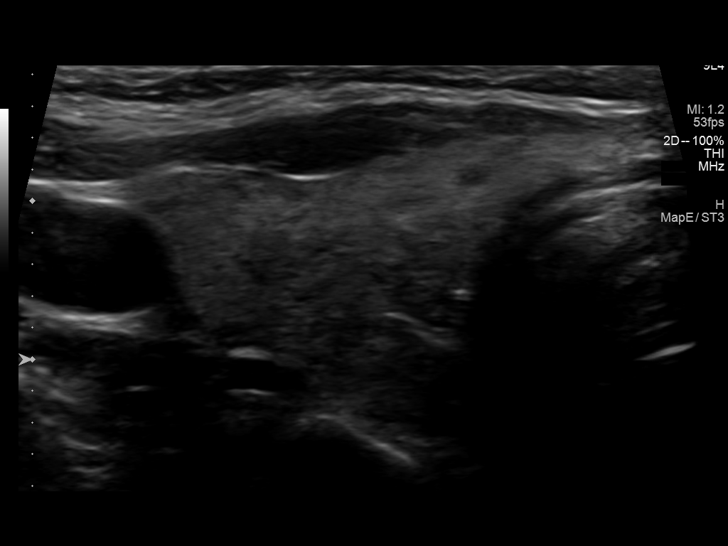
[im 14/33]
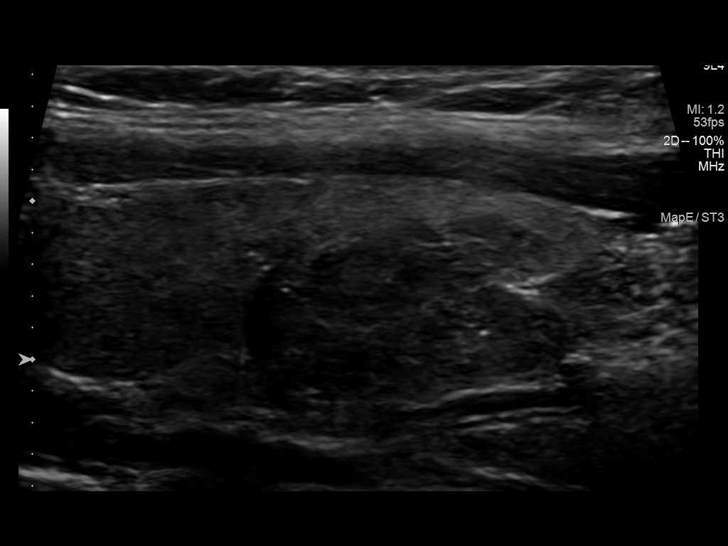
[im 17/33]
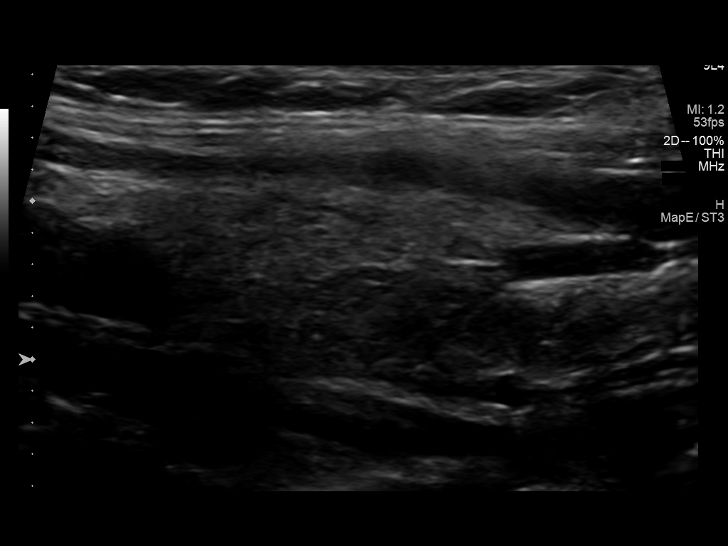
[im 19/33]
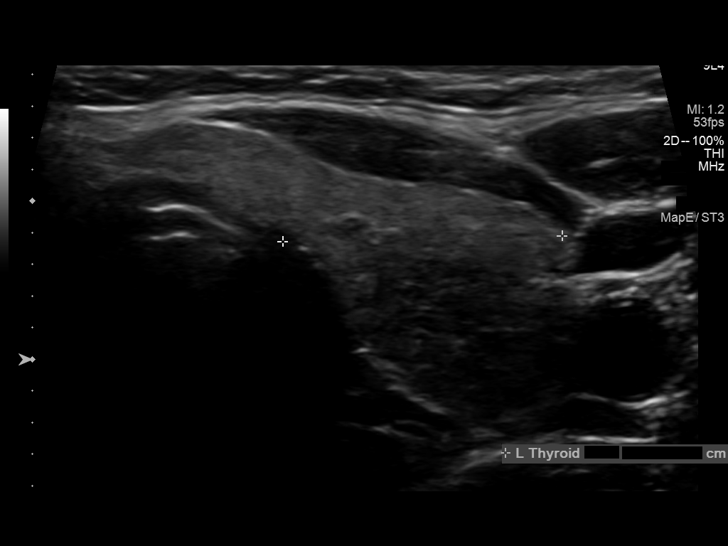
[im 22/33]
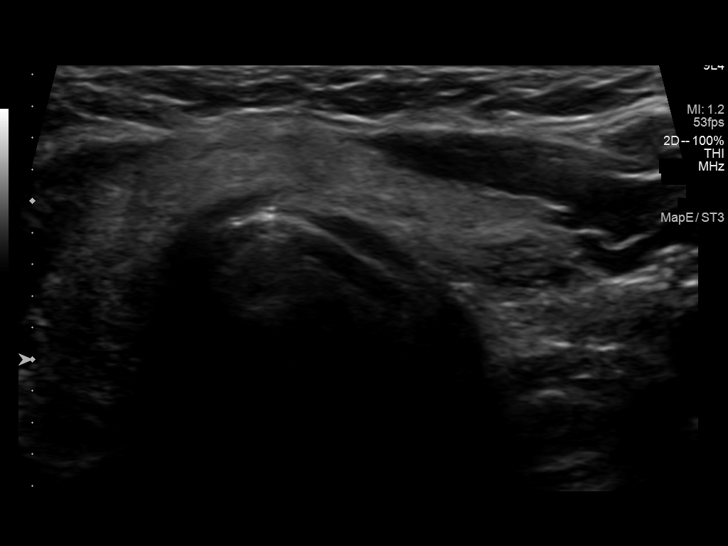
[im 25/33]
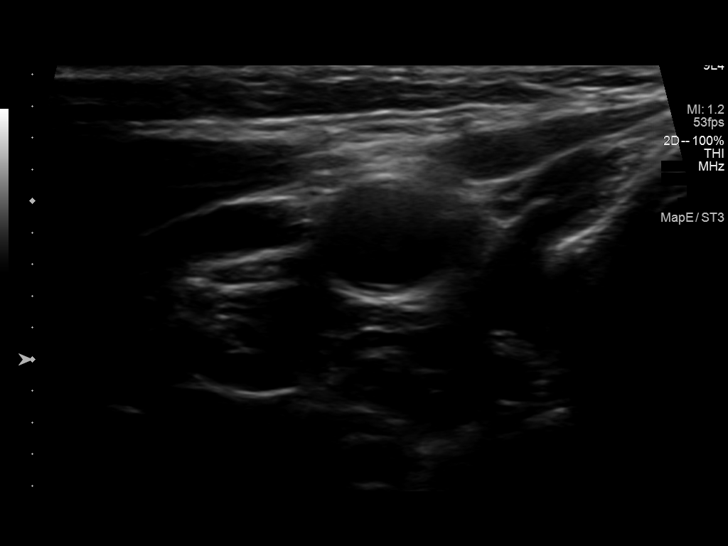
[im 27/33]
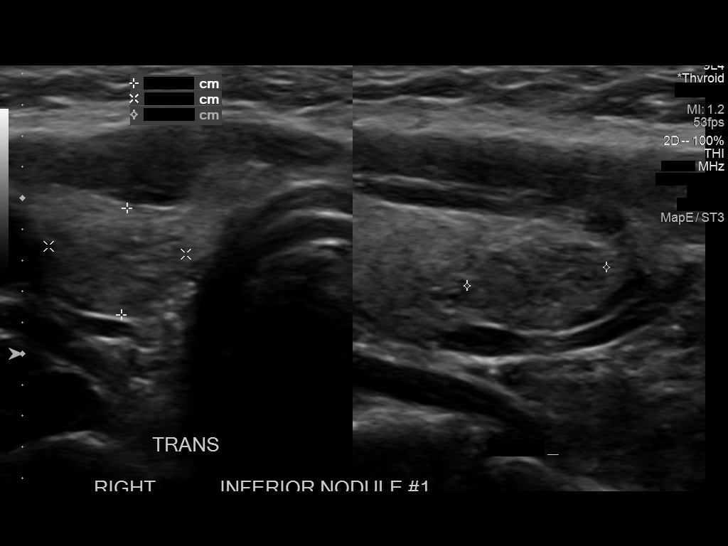
[im 30/33]
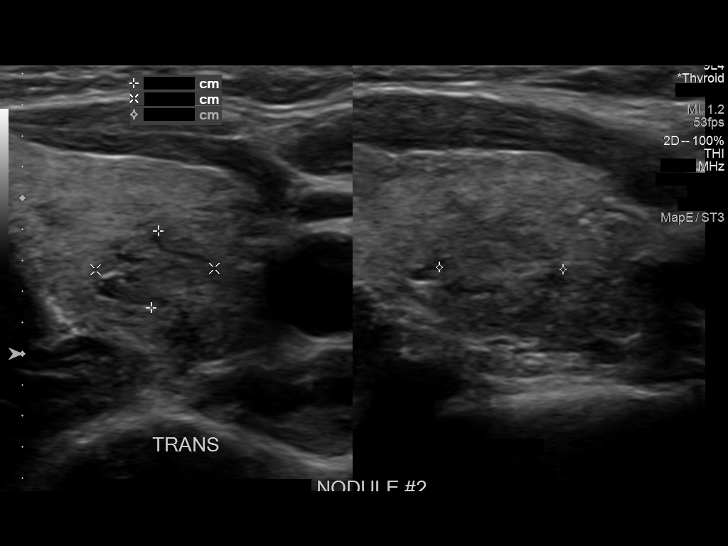
[im 33/33]
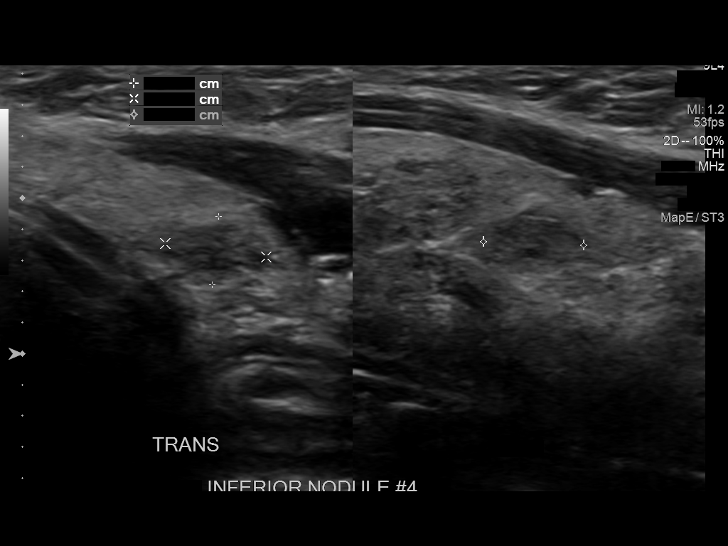

[13 of 25 positions shown; findings below may reference images not displayed]

FINDINGS: Parenchymal Echotexture: Mildly heterogenous

Isthmus: 0.4 cm

Right lobe: 3.7 x 1.5 x 2.0 cm

Left lobe: 3.7 x 1.4 x 1.8 cm

_________________________________________________________

Estimated total number of nodules >/= 1 cm: 0

Number of spongiform nodules >/=  2 cm not described below (TR1): 0

Number of mixed cystic and solid nodules >/= 1.5 cm not described
below (TR2): 0

_________________________________________________________

Nodule 1: Previously biopsied right inferior thyroid nodule is not
significantly changed in size allowing for differences measuring
technique. Please correlate with prior FNA results.

_________________________________________________________

Nodule 2: 0.8 x 0.8 x 0.5 cm ossicle echo solid nodule in the
superior left thyroid lobe does not meet criteria for FNA or imaging
follow-up.

_________________________________________________________

Nodule 3: Previously biopsied left inferior thyroid nodule does not
appear significantly changed in size on the current examination
measuring 1.8 x 1.3 x 0.8 cm compared to 2.5 x 1.7 x 1.3 cm on
11/06/2014. This favors a benign etiology. FNA of this nodule was
performed on 12/22/2014. Please correlate with prior results.
IMPRESSION: 1. Previously biopsied bilateral thyroid nodules are not
significantly changed in size. Please correlate with FNA results.
2. No new suspicious thyroid nodules meeting criteria for follow-up
or FNA.

The above is in keeping with the ACR TI-RADS recommendations - [HOSPITAL] 9188;[DATE].

## 2022-11-01 DIAGNOSIS — B356 Tinea cruris: Secondary | ICD-10-CM | POA: Diagnosis not present

## 2022-11-15 ENCOUNTER — Ambulatory Visit: Payer: BLUE CROSS/BLUE SHIELD | Admitting: Neurology

## 2022-11-15 DIAGNOSIS — G244 Idiopathic orofacial dystonia: Secondary | ICD-10-CM

## 2022-11-15 DIAGNOSIS — G245 Blepharospasm: Secondary | ICD-10-CM

## 2022-11-15 MED ORDER — ONABOTULINUMTOXINA 200 UNITS IJ SOLR: 155.0000 [IU] | Freq: Once | INTRAMUSCULAR | Status: DC

## 2022-11-15 MED ORDER — ONABOTULINUMTOXINA 200 UNITS IJ SOLR
100.0000 [IU] | Freq: Once | INTRAMUSCULAR | Status: AC
  Administered 2022-11-15: 100 [IU] via INTRAMUSCULAR

## 2022-11-15 NOTE — Progress Notes (Signed)
Botox- 200 units x 1 vial (100 units used)  Lot: [id number] Expiration: 11/2024 NDC: 6578-4696-29  Bacteriostatic 0.9% Sodium Chloride- 2 mL  Lot: [id number] Expiration: 09/25/2023 NDC: 5284-1324-40  Dx: G24.4  B/B Witnessed by Delmer Islam

## 2022-11-15 NOTE — Progress Notes (Addendum)
  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  Symptoms started years ago with pain in the jaw muscles, limited mouth opening to where she has decreased intake and weight loss, also dysarthria and temporomandibular joint problems due to the symptoms. Progressing. impairing her daily activities with social embarrassment, inability to work, weight loss and forming a significant impact on the overall quality of life of the patient.  Inability to open her jaw limits the maximal mouth opening and hampers speech, mastication/eating. 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   Medications tried: Patient's been under the care of neurology, primary care, she has tried physical therapy, massage, chiropractic, analgesics, muscle relaxer such as Flexeril and baclofen which gave her significant side effects such as sedation.  These were tried over a >27-month period without reduction of pain or quality of life or range of motion. she has headaches, she has spoken to her dentist in the past, had mouth guards, her teeth are getting sensitive. She has tried mouth guard. She cannot open her mouth sometime because it hurts so much, she is having difficulty eating, getting temporal headaches. Ask for 100 units of botox to decrease pain, increase Range of motion and improve quality of life.   Response: She is much improved, feels functionality is better, decreased pain, increased range of motion of jaw, can eat without pain, can open her mouth/jaw wide, blepharospasm improved.   Side effects none    reviewed w/ pt the procedure of botulinum toxin, incl side effects - localized weakness, inj site rxn, myalgia and spread from site of injection    Procedure note    EMG: EMG guidance was not used today to inject muscles  detailed below but may be used in the future. Aseptic procedure was performed and patient tolerated procedure. Procedure was performed by Dr. Azell Der    A 31 gauge needle was used to inject  Patient tolerated the procedure.      Refractory to oral medications  Adjusted dose today to optimize response 35u each masseter in 5 equally spaced locations   Motrin / tylenol for injections site pain / soreness    REMS precautions handout given to patient    RTC - see instructions for details

## 2023-01-09 DIAGNOSIS — K219 Gastro-esophageal reflux disease without esophagitis: Secondary | ICD-10-CM | POA: Diagnosis not present

## 2023-01-09 DIAGNOSIS — B029 Zoster without complications: Secondary | ICD-10-CM | POA: Diagnosis not present

## 2023-01-09 DIAGNOSIS — M7071 Other bursitis of hip, right hip: Secondary | ICD-10-CM | POA: Diagnosis not present

## 2023-01-16 ENCOUNTER — Telehealth: Payer: Self-pay | Admitting: Neurology

## 2023-01-16 NOTE — Telephone Encounter (Signed)
Pt is filling out Botox Savings program paperwork to submit, has received records from [place] and states there is a problem with the DX code - would like a call back.

## 2023-01-17 NOTE — Telephone Encounter (Signed)
LVM and sent MyChart msg returning pt's call.

## 2023-02-13 ENCOUNTER — Ambulatory Visit: Payer: Self-pay | Admitting: Neurology

## 2023-02-13 ENCOUNTER — Encounter: Payer: Self-pay | Admitting: Neurology

## 2023-02-13 DIAGNOSIS — G245 Blepharospasm: Secondary | ICD-10-CM

## 2023-02-13 DIAGNOSIS — G244 Idiopathic orofacial dystonia: Secondary | ICD-10-CM

## 2023-02-13 NOTE — Progress Notes (Unsigned)
  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  Symptoms started years ago with pain in the jaw muscles, limited mouth opening to where she has decreased intake and weight loss, also dysarthria and temporomandibular joint problems due to the symptoms. Progressing. impairing her daily activities with social embarrassment, inability to work, weight loss and forming a significant impact on the overall quality of life of the patient.  Inability to open her jaw limits the maximal mouth opening and hampers speech, mastication/eating. 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   Medications tried: Patient's been under the care of neurology, primary care, she has tried physical therapy, massage, chiropractic, analgesics, muscle relaxer such as Flexeril and baclofen which gave her significant side effects such as sedation.  These were tried over a >22-month period without reduction of pain or quality of life or range of motion. she has headaches, she has spoken to her dentist in the past, had mouth guards, her teeth are getting sensitive. She has tried mouth guard. She cannot open her mouth sometime because it hurts so much, she is having difficulty eating, getting temporal headaches. Ask for 100 units of botox to decrease pain, increase Range of motion and improve quality of life.   Response: She is much improved, feels functionality is better, decreased pain, increased range of motion of jaw, can eat without pain, can open her mouth/jaw wide, blepharospasm improved.   Side effects none    reviewed w/ pt the procedure of botulinum toxin, incl side effects - localized weakness, inj site rxn, myalgia and spread from site of injection    Procedure note    EMG: EMG guidance was not used today to inject muscles  detailed below but may be used in the future. Aseptic procedure was performed and patient tolerated procedure. Procedure was performed by Dr. Azell Der    A 31 gauge needle was used to inject  Patient tolerated the procedure.      Refractory to oral medications  Adjusted dose today to optimize response 35u each masseter in 5 equally spaced locations   Motrin / tylenol for injections site pain / soreness    REMS precautions handout given to patient    RTC - see instructions for details

## 2023-03-14 NOTE — Telephone Encounter (Signed)
Sent message to pt relaying information regarding Xeomin.

## 2023-03-14 NOTE — Telephone Encounter (Signed)
-----   Message from Anson Fret sent at 03/14/2023 11:58 AM EDT ----- Regarding: RE: look into using xeomin 50 Please let patient know I guess this means she would have to pay out of pocket, thank you ----- Message ----- From: Yancey Flemings, NT Sent: 03/14/2023   9:55 AM EDT To: Anson Fret, MD Subject: RE: look into using xeomin 50                  I looked into the Xeomin and they also do not have orofacial dystonia listed as an eligible diagnosis for the savings program. ----- Message ----- From: Anson Fret, MD Sent: 03/13/2023   5:14 PM EDT To: Yancey Flemings, NT Subject: FW: look into using xeomin 50                  Patient cannot get reimbursed from botox for the botox 100U with the code I used, I cannot use blepharospasm. Can we see if xeomin has a similar program that reimburses for costs for orofacial dystonia please? ----- Message ----- From: Anson Fret, MD Sent: 02/14/2023   5:41 PM EDT To: Anson Fret, MD Subject: look into using xeomin 50                      Look into using xeomin 50

## 2023-03-22 DIAGNOSIS — Z713 Dietary counseling and surveillance: Secondary | ICD-10-CM | POA: Diagnosis not present

## 2023-03-22 DIAGNOSIS — Z9884 Bariatric surgery status: Secondary | ICD-10-CM | POA: Diagnosis not present

## 2023-03-30 DIAGNOSIS — Z48815 Encounter for surgical aftercare following surgery on the digestive system: Secondary | ICD-10-CM | POA: Diagnosis not present

## 2023-03-30 DIAGNOSIS — Z713 Dietary counseling and surveillance: Secondary | ICD-10-CM | POA: Diagnosis not present

## 2023-03-30 DIAGNOSIS — E669 Obesity, unspecified: Secondary | ICD-10-CM | POA: Diagnosis not present

## 2023-03-30 DIAGNOSIS — Z6839 Body mass index (BMI) 39.0-39.9, adult: Secondary | ICD-10-CM | POA: Diagnosis not present

## 2023-03-30 DIAGNOSIS — Z9884 Bariatric surgery status: Secondary | ICD-10-CM | POA: Diagnosis not present

## 2023-04-01 DIAGNOSIS — R109 Unspecified abdominal pain: Secondary | ICD-10-CM | POA: Diagnosis not present

## 2023-04-13 DIAGNOSIS — Z23 Encounter for immunization: Secondary | ICD-10-CM | POA: Diagnosis not present

## 2023-04-13 DIAGNOSIS — Z0000 Encounter for general adult medical examination without abnormal findings: Secondary | ICD-10-CM | POA: Diagnosis not present

## 2023-04-19 DIAGNOSIS — E669 Obesity, unspecified: Secondary | ICD-10-CM | POA: Diagnosis not present

## 2023-04-19 DIAGNOSIS — K912 Postsurgical malabsorption, not elsewhere classified: Secondary | ICD-10-CM | POA: Diagnosis not present

## 2023-04-19 DIAGNOSIS — Z9884 Bariatric surgery status: Secondary | ICD-10-CM | POA: Diagnosis not present

## 2023-04-19 DIAGNOSIS — E739 Lactose intolerance, unspecified: Secondary | ICD-10-CM | POA: Diagnosis not present

## 2023-08-20 ENCOUNTER — Emergency Department (HOSPITAL_BASED_OUTPATIENT_CLINIC_OR_DEPARTMENT_OTHER): Payer: BC Managed Care – PPO

## 2023-08-20 ENCOUNTER — Other Ambulatory Visit: Payer: Self-pay

## 2023-08-20 ENCOUNTER — Emergency Department (HOSPITAL_BASED_OUTPATIENT_CLINIC_OR_DEPARTMENT_OTHER)
Admission: EM | Admit: 2023-08-20 | Discharge: 2023-08-21 | Disposition: A | Discharge status: Home / Self Care | Payer: BC Managed Care – PPO | Attending: Emergency Medicine | Admitting: Emergency Medicine

## 2023-08-20 ENCOUNTER — Encounter (HOSPITAL_BASED_OUTPATIENT_CLINIC_OR_DEPARTMENT_OTHER): Payer: Self-pay | Admitting: Emergency Medicine

## 2023-08-20 DIAGNOSIS — I10 Essential (primary) hypertension: Secondary | ICD-10-CM | POA: Insufficient documentation

## 2023-08-20 DIAGNOSIS — R079 Chest pain, unspecified: Secondary | ICD-10-CM | POA: Diagnosis not present

## 2023-08-20 DIAGNOSIS — M79661 Pain in right lower leg: Secondary | ICD-10-CM | POA: Diagnosis not present

## 2023-08-20 DIAGNOSIS — M7989 Other specified soft tissue disorders: Secondary | ICD-10-CM | POA: Diagnosis not present

## 2023-08-20 DIAGNOSIS — M79662 Pain in left lower leg: Secondary | ICD-10-CM | POA: Insufficient documentation

## 2023-08-20 DIAGNOSIS — R0789 Other chest pain: Secondary | ICD-10-CM | POA: Diagnosis not present

## 2023-08-20 DIAGNOSIS — R0689 Other abnormalities of breathing: Secondary | ICD-10-CM | POA: Diagnosis not present

## 2023-08-20 DIAGNOSIS — M79605 Pain in left leg: Secondary | ICD-10-CM

## 2023-08-20 LAB — BASIC METABOLIC PANEL
Anion gap: 10 (ref 5–15)
BUN: 15 mg/dL (ref 6–20)
CO2: 25 mmol/L (ref 22–32)
Calcium: 9.4 mg/dL (ref 8.9–10.3)
Chloride: 102 mmol/L (ref 98–111)
Creatinine, Ser: 0.77 mg/dL (ref 0.44–1.00)
GFR, Estimated: >60 mL/min (ref >60)
Glucose, Bld: 91 mg/dL (ref 70–99)
Potassium: 4 mmol/L (ref 3.5–5.1)
Sodium: 137 mmol/L (ref 135–145)

## 2023-08-20 LAB — CBC
HCT: 42.3 % (ref 36.0–46.0)
Hemoglobin: 13.9 g/dL (ref 12.0–15.0)
MCH: 29.1 pg (ref 26.0–34.0)
MCHC: 32.9 g/dL (ref 30.0–36.0)
MCV: 88.5 fL (ref 80.0–100.0)
Platelets: 366 10*3/uL (ref 150–400)
RBC: 4.78 MIL/uL (ref 3.87–5.11)
RDW: 12 % (ref 11.5–15.5)
WBC: 9.2 10*3/uL (ref 4.0–10.5)
nRBC: 0 % (ref 0.0–0.2)

## 2023-08-20 NOTE — ED Notes (Signed)
 ED Provider at bedside.

## 2023-08-20 NOTE — ED Triage Notes (Addendum)
 Increasingly sedentary recently. Left leg swollen and tender especially in calf. Some SOB starting 2 days ago worse with standing. No resp symptoms otherwise.

## 2023-08-20 NOTE — ED Triage Notes (Addendum)
 Pt is [place] per [place] tech.

## 2023-08-20 NOTE — ED Provider Notes (Signed)
 [facility]GENC[facility] Emergency Department Provider Note MRN:  [id number]  Arrival date & time: 08/21/23     Chief Complaint   Leg Pain   History of Present Illness   Wendy Tapia is a 38 y.o. year-old female with a history of PCOS presenting to the ED with chief complaint of leg pain.  Pain to the legs recently, concerned that she does not move around very much, fairly sedentary due to working from home.  Noticed some chest pain intermittently today with shortness of breath, doctor at urgent care felt that her left leg was larger than right leg, sent here for evaluation for blood clots.  Review of Systems  A thorough review of systems was obtained and all systems are negative except as noted in the HPI and PMH.   Patient's Health History    Past Medical History:  Diagnosis Date   Headache    migraine   High blood pressure    PCOS (polycystic ovarian syndrome)    Thyroid disease     Past Surgical History:  Procedure Laterality Date   BARIATRIC SURGERY  09/25/2019   BIOPSY THYROID     benign    Family History  Problem Relation Age of Onset   Thyroid disease Mother    Migraines Mother    Aneurysm Father        brain, surgery   Hypertension Father    Kidney failure Father    Cancer Maternal Grandmother        stomach   Diabetes Maternal Grandmother    Asthma Brother        childhood    Diabetes Paternal Grandmother    Other Paternal Grandmother        eye disease    Social History   Socioeconomic History   Marital status: Legally Separated    Spouse name: Lyda Jester   Number of children: 0   Years of education: 16   Highest education level: Not on file  Occupational History    Comment: HR Assistant, Advertising account planner  Tobacco Use   Smoking status: Never   Smokeless tobacco: Never  Vaping Use   Vaping status: Never Used  Substance and Sexual Activity   Alcohol use: No    Comment: intolerance to alcohol   Drug use: No   Sexual activity: Not  on file  Other Topics Concern   Not on file  Social History Narrative   Lives with husband   Caffeine - none at this time due to bariatric surgery   Right handed   Social Drivers of Corporate investment banker Strain: Not on file  Food Insecurity: Not on file  Transportation Needs: Not on file  Physical Activity: Not on file  Stress: Not on file  Social Connections: Not on file  Intimate Partner Violence: Not on file     Physical Exam   Vitals:   08/20/23 2235 08/20/23 2350  BP:  (!) 140/83  Pulse:    Resp:  17  Temp: 98.1 F (36.7 C)   SpO2:      CONSTITUTIONAL: Well-appearing, NAD NEURO/PSYCH:  Alert and oriented x 3, no focal deficits EYES:  eyes equal and reactive ENT/NECK:  no LAD, no JVD CARDIO: Regular rate, well-perfused, normal S1 and S2 PULM:  CTAB no wheezing or rhonchi GI/GU:  non-distended, non-tender MSK/SPINE:  No gross deformities, no edema SKIN:  no rash, atraumatic   *Additional and/or pertinent findings included in MDM below  Diagnostic and Interventional Summary  EKG Interpretation Date/Time:  August 20, 2023 at 23:46:22 Ventricular Rate:   62 PR Interval:   188 QRS Duration:   89 QT Interval:   388 QTC Calculation:  394 R Axis:      Text Interpretation: Sinus rhythm, normal intervals       Labs Reviewed  CBC  BASIC METABOLIC PANEL  D-DIMER, QUANTITATIVE  TROPONIN I (HIGH SENSITIVITY)    DG Chest Port 1 View  Final Result    US Venous Img Lower Unilateral Left  Final Result      Medications - No data to display   Procedures  /  Critical Care Procedures  ED Course and Medical Decision Making  Initial Impression and Ddx Patient is sitting comfortably in no acute distress with normal vital signs, no tachycardia, no hypoxia, really has minimal risk factors for DVT, does not use birth control pills.  Has some sedentary habits and apparently the outpatient doctor measured her calf circumference and the left leg was  bigger and so she is here for evaluation.  Obtaining D-dimer, ultrasound, will reassess.  Past medical/surgical history that increases complexity of ED encounter: None  Interpretation of Diagnostics I personally reviewed the Chest Xray and my interpretation is as follows: No pneumothorax or lobar opacity  No blood count or electrolyte disturbance, negative troponin, negative D-dimer.  Negative DVT ultrasound.  Patient Reassessment and Ultimate Disposition/Management     Discharge  Patient management required discussion with the following services or consulting groups:  None  Complexity of Problems Addressed Acute illness or injury that poses threat of life of bodily function  Additional Data Reviewed and Analyzed Further history obtained from: None  Additional Factors Impacting ED Encounter Risk None  Elmer Sow. Pilar Plate, MD [facility] [email or web address]  Final Clinical Impressions(s) / ED Diagnoses     ICD-10-CM   1. Chest pain, unspecified type  R07.9     2. Pain of left lower extremity  M79.605       ED Discharge Orders     None        Discharge Instructions Discussed with and Provided to Patient:     Discharge Instructions      You were evaluated in the Emergency Department and after careful evaluation, we did not find any emergent condition requiring admission or further testing in the hospital.  Your exam/testing today is overall reassuring.  No signs of blood clots or other emergencies.  Recommend continued follow-up with your primary care doctor to discuss her symptoms.  Please return to the Emergency Department if you experience any worsening of your condition.   Thank you for allowing [place] to be a part of your care.       Sabas Sous, MD 08/21/23 [phone]

## 2023-08-21 DIAGNOSIS — R079 Chest pain, unspecified: Secondary | ICD-10-CM | POA: Diagnosis not present

## 2023-08-21 DIAGNOSIS — M7989 Other specified soft tissue disorders: Secondary | ICD-10-CM | POA: Diagnosis not present

## 2023-08-21 LAB — D-DIMER, QUANTITATIVE: D-Dimer, Quant: 0.36 ug{FEU}/mL (ref 0.00–0.50)

## 2023-08-21 LAB — TROPONIN I (HIGH SENSITIVITY): Troponin I (High Sensitivity): <2 ng/L (ref <18)

## 2023-08-21 NOTE — Discharge Instructions (Signed)
 You were evaluated in the Emergency Department and after careful evaluation, we did not find any emergent condition requiring admission or further testing in the hospital.  Your exam/testing today is overall reassuring.  No signs of blood clots or other emergencies.  Recommend continued follow-up with your primary care doctor to discuss her symptoms.  Please return to the Emergency Department if you experience any worsening of your condition.   Thank you for allowing [place] to be a part of your care.

## 2023-08-29 DIAGNOSIS — R7309 Other abnormal glucose: Secondary | ICD-10-CM | POA: Diagnosis not present

## 2023-08-29 DIAGNOSIS — R252 Cramp and spasm: Secondary | ICD-10-CM | POA: Diagnosis not present

## 2023-08-29 DIAGNOSIS — E039 Hypothyroidism, unspecified: Secondary | ICD-10-CM | POA: Diagnosis not present

## 2023-09-10 ENCOUNTER — Telehealth: Payer: BLUE CROSS/BLUE SHIELD | Admitting: Neurology

## 2023-12-20 ENCOUNTER — Other Ambulatory Visit: Payer: Self-pay | Admitting: Family Medicine

## 2023-12-20 DIAGNOSIS — R0602 Shortness of breath: Secondary | ICD-10-CM | POA: Diagnosis not present

## 2023-12-20 DIAGNOSIS — F411 Generalized anxiety disorder: Secondary | ICD-10-CM | POA: Diagnosis not present

## 2023-12-20 DIAGNOSIS — K219 Gastro-esophageal reflux disease without esophagitis: Secondary | ICD-10-CM | POA: Diagnosis not present

## 2023-12-21 ENCOUNTER — Ambulatory Visit
Admission: RE | Admit: 2023-12-21 | Discharge: 2023-12-21 | Disposition: A | Discharge status: Home / Self Care | Source: Ambulatory Visit | Attending: Family Medicine | Admitting: Family Medicine

## 2023-12-21 DIAGNOSIS — R0602 Shortness of breath: Secondary | ICD-10-CM

## 2023-12-21 DIAGNOSIS — R0600 Dyspnea, unspecified: Secondary | ICD-10-CM | POA: Diagnosis not present

## 2023-12-21 MED ORDER — IOPAMIDOL (ISOVUE-370) INJECTION 76%
80.0000 mL | Freq: Once | INTRAVENOUS | Status: AC | PRN
  Administered 2023-12-21: 80 mL via INTRAVENOUS

## 2023-12-26 DIAGNOSIS — M2241 Chondromalacia patellae, right knee: Secondary | ICD-10-CM | POA: Diagnosis not present

## 2023-12-26 DIAGNOSIS — F411 Generalized anxiety disorder: Secondary | ICD-10-CM | POA: Diagnosis not present

## 2023-12-26 DIAGNOSIS — E039 Hypothyroidism, unspecified: Secondary | ICD-10-CM | POA: Diagnosis not present

## 2023-12-26 DIAGNOSIS — E042 Nontoxic multinodular goiter: Secondary | ICD-10-CM | POA: Diagnosis not present

## 2023-12-27 ENCOUNTER — Other Ambulatory Visit: Payer: Self-pay | Admitting: Family Medicine

## 2023-12-27 DIAGNOSIS — E042 Nontoxic multinodular goiter: Secondary | ICD-10-CM

## 2024-01-02 ENCOUNTER — Ambulatory Visit
Admission: RE | Admit: 2024-01-02 | Discharge: 2024-01-02 | Disposition: A | Discharge status: Home / Self Care | Source: Ambulatory Visit | Attending: Family Medicine | Admitting: Family Medicine

## 2024-01-02 DIAGNOSIS — E042 Nontoxic multinodular goiter: Secondary | ICD-10-CM

## 2024-01-02 DIAGNOSIS — E041 Nontoxic single thyroid nodule: Secondary | ICD-10-CM | POA: Diagnosis not present

## 2024-02-04 DIAGNOSIS — N644 Mastodynia: Secondary | ICD-10-CM | POA: Diagnosis not present

## 2024-02-04 DIAGNOSIS — Z131 Encounter for screening for diabetes mellitus: Secondary | ICD-10-CM | POA: Diagnosis not present

## 2024-02-04 DIAGNOSIS — N979 Female infertility, unspecified: Secondary | ICD-10-CM | POA: Diagnosis not present

## 2024-02-19 DIAGNOSIS — R300 Dysuria: Secondary | ICD-10-CM | POA: Diagnosis not present

## 2024-02-21 DIAGNOSIS — R92323 Mammographic fibroglandular density, bilateral breasts: Secondary | ICD-10-CM | POA: Diagnosis not present

## 2024-02-21 DIAGNOSIS — N644 Mastodynia: Secondary | ICD-10-CM | POA: Diagnosis not present

## 2024-02-21 DIAGNOSIS — R928 Other abnormal and inconclusive findings on diagnostic imaging of breast: Secondary | ICD-10-CM | POA: Diagnosis not present

## 2024-03-18 DIAGNOSIS — Z1331 Encounter for screening for depression: Secondary | ICD-10-CM | POA: Diagnosis not present

## 2024-03-18 DIAGNOSIS — K912 Postsurgical malabsorption, not elsewhere classified: Secondary | ICD-10-CM | POA: Diagnosis not present

## 2024-03-18 DIAGNOSIS — Z01411 Encounter for gynecological examination (general) (routine) with abnormal findings: Secondary | ICD-10-CM | POA: Diagnosis not present

## 2024-03-18 DIAGNOSIS — Z9884 Bariatric surgery status: Secondary | ICD-10-CM | POA: Diagnosis not present

## 2024-03-18 DIAGNOSIS — Z01419 Encounter for gynecological examination (general) (routine) without abnormal findings: Secondary | ICD-10-CM | POA: Diagnosis not present

## 2024-03-18 DIAGNOSIS — R300 Dysuria: Secondary | ICD-10-CM | POA: Diagnosis not present

## 2024-03-18 DIAGNOSIS — N979 Female infertility, unspecified: Secondary | ICD-10-CM | POA: Diagnosis not present

## 2024-03-18 DIAGNOSIS — N898 Other specified noninflammatory disorders of vagina: Secondary | ICD-10-CM | POA: Diagnosis not present

## 2024-03-18 DIAGNOSIS — Z713 Dietary counseling and surveillance: Secondary | ICD-10-CM | POA: Diagnosis not present

## 2024-03-28 DIAGNOSIS — Z6839 Body mass index (BMI) 39.0-39.9, adult: Secondary | ICD-10-CM | POA: Diagnosis not present

## 2024-03-28 DIAGNOSIS — Z713 Dietary counseling and surveillance: Secondary | ICD-10-CM | POA: Diagnosis not present

## 2024-03-28 DIAGNOSIS — Z9884 Bariatric surgery status: Secondary | ICD-10-CM | POA: Diagnosis not present

## 2024-04-01 DIAGNOSIS — N979 Female infertility, unspecified: Secondary | ICD-10-CM | POA: Diagnosis not present

## 2024-04-16 DIAGNOSIS — Z9884 Bariatric surgery status: Secondary | ICD-10-CM | POA: Diagnosis not present

## 2024-04-16 DIAGNOSIS — E039 Hypothyroidism, unspecified: Secondary | ICD-10-CM | POA: Diagnosis not present

## 2024-04-16 DIAGNOSIS — Z0000 Encounter for general adult medical examination without abnormal findings: Secondary | ICD-10-CM | POA: Diagnosis not present

## 2024-04-16 DIAGNOSIS — F411 Generalized anxiety disorder: Secondary | ICD-10-CM | POA: Diagnosis not present

## 2024-04-16 DIAGNOSIS — Z8619 Personal history of other infectious and parasitic diseases: Secondary | ICD-10-CM | POA: Diagnosis not present

## 2024-04-17 DIAGNOSIS — Z713 Dietary counseling and surveillance: Secondary | ICD-10-CM | POA: Diagnosis not present

## 2024-04-17 DIAGNOSIS — K912 Postsurgical malabsorption, not elsewhere classified: Secondary | ICD-10-CM | POA: Diagnosis not present

## 2024-04-17 DIAGNOSIS — Z9884 Bariatric surgery status: Secondary | ICD-10-CM | POA: Diagnosis not present

## 2024-04-17 DIAGNOSIS — Z48815 Encounter for surgical aftercare following surgery on the digestive system: Secondary | ICD-10-CM | POA: Diagnosis not present

## 2024-05-19 DIAGNOSIS — K219 Gastro-esophageal reflux disease without esophagitis: Secondary | ICD-10-CM | POA: Diagnosis not present

## 2024-05-27 DIAGNOSIS — R142 Eructation: Secondary | ICD-10-CM | POA: Diagnosis not present

## 2024-05-27 DIAGNOSIS — R140 Abdominal distension (gaseous): Secondary | ICD-10-CM | POA: Diagnosis not present

## 2024-05-27 DIAGNOSIS — K219 Gastro-esophageal reflux disease without esophagitis: Secondary | ICD-10-CM | POA: Diagnosis not present

## 2024-05-27 DIAGNOSIS — R1013 Epigastric pain: Secondary | ICD-10-CM | POA: Diagnosis not present

## 2024-06-12 DIAGNOSIS — Z9884 Bariatric surgery status: Secondary | ICD-10-CM | POA: Diagnosis not present

## 2024-06-12 DIAGNOSIS — R140 Abdominal distension (gaseous): Secondary | ICD-10-CM | POA: Diagnosis not present

## 2024-06-12 DIAGNOSIS — R1013 Epigastric pain: Secondary | ICD-10-CM | POA: Diagnosis not present

## 2024-06-12 DIAGNOSIS — K219 Gastro-esophageal reflux disease without esophagitis: Secondary | ICD-10-CM | POA: Diagnosis not present

## 2024-06-18 DIAGNOSIS — K219 Gastro-esophageal reflux disease without esophagitis: Secondary | ICD-10-CM | POA: Diagnosis not present

## 2024-06-18 DIAGNOSIS — R0600 Dyspnea, unspecified: Secondary | ICD-10-CM | POA: Diagnosis not present

## 2024-07-01 ENCOUNTER — Ambulatory Visit (HOSPITAL_BASED_OUTPATIENT_CLINIC_OR_DEPARTMENT_OTHER): Admitting: Pulmonary Disease

## 2024-07-01 ENCOUNTER — Encounter (HOSPITAL_BASED_OUTPATIENT_CLINIC_OR_DEPARTMENT_OTHER): Payer: Self-pay | Admitting: Pulmonary Disease

## 2024-07-01 VITALS — BP 115/82 | HR 60 | Temp 98.1°F | Ht 66.0 in | Wt 238.4 lb

## 2024-07-01 DIAGNOSIS — R0602 Shortness of breath: Secondary | ICD-10-CM

## 2024-07-01 NOTE — Progress Notes (Signed)
 "   Subjective:   PATIENT ID: Wendy Tapia GENDER: female DOB: 07-May-1986, MRN: [id number]  Chief Complaint  Patient presents with   Establish Care    Dyspnea     Reason for Visit: New consult for dyspnea     Jennavecia Schwier is a 39 y.o. female with hx gastric bypass, hx HTN, allergic rhinitis, migraine and anxiety who presents for shortness of breath.  Social History: Never smoker Lives with someone who vapes indoor     07/01/2024 Discussed the use of AI scribe software for clinical note transcription with the patient, who gave verbal consent to proceed.  History of Present Illness Wendy Tapia is a 39 year old female with a history of gastric bypass surgery and silent acid reflux who presents with breathing issues.  She has experienced breathing difficulties since 2025, characterized by a sensation of not getting enough air, particularly when eating. She sometimes feels chest tightness, described as 'an elephant sitting on my chest.' There is no wheezing or frequent choking, but occasional shortness of breath occurs when getting up or after meals. Home pulse oximeter readings show oxygen levels between 97% and 100%.  She underwent gastric bypass surgery on September 25, 2019, and has a history of silent acid reflux, which initially improved post-surgery but has had flare-ups, notably in November 2025. Despite maximum doses of pantoprazole and evening famotidine, symptoms persisted. She adheres to lifestyle modifications, such as avoiding trigger foods, not using straws, and sleeping elevated. An endoscopy revealed no significant findings.  Her family history includes asthma affecting her father and brother. She experiences intermittent anxiety, for which she has medication but has not taken it recently. She works from home in a sedentary job and lives in a small, one-level house. She is exposed to secondhand vape smoke indoors.  In early 2025, she visited the hospital for  similar symptoms, where tests ruled out blood clots. A sleep apnea test in 2021 was negative. She denies any history of smoking but is exposed to secondhand vape smoke at home.     Past Medical History:  Diagnosis Date   Headache    migraine   High blood pressure    PCOS (polycystic ovarian syndrome)    Thyroid  disease      Family History  Problem Relation Age of Onset   Thyroid  disease Mother    Migraines Mother    Aneurysm Father        brain, surgery   Hypertension Father    Kidney failure Father    Cancer Maternal Grandmother        stomach   Diabetes Maternal Grandmother    Asthma Brother        childhood    Diabetes Paternal Grandmother    Other Paternal Grandmother        eye disease     Social History   Occupational History    Comment: HR Geophysicist/field Seismologist, [employer] Account [employer]  Tobacco Use   Smoking status: Never   Smokeless tobacco: Never  Vaping Use   Vaping status: Never Used  Substance and Sexual Activity   Alcohol use: No    Comment: intolerance to alcohol   Drug use: No   Sexual activity: Not on file    Allergies[1]   Outpatient Medications Prior to Visit  Medication Sig Dispense Refill   cetirizine (ZYRTEC) 10 MG tablet Take 10 mg by mouth daily as needed for allergies.     cyclobenzaprine  (FLEXERIL ) 10 MG tablet TAKE 1/2 TO 1 TABLET (  5-10 MG TOTAL) BY MOUTH EVERY DAY AT BEDTIME 30 tablet 0   Inositol-D Chiro-Inositol (OVASITOL PO) Take by mouth.     levothyroxine (SYNTHROID, LEVOTHROID) 50 MCG tablet Take 50 mcg by mouth daily before breakfast.     pantoprazole (PROTONIX) 40 MG tablet Take by mouth.     Rimegepant Sulfate (NURTEC) 75 MG TBDP Take 1 tablet (75 mg total) by mouth daily as needed. For migraines. Take as close to onset of migraine as possible. One daily maximum. 4 tablet 0   rizatriptan  (MAXALT -MLT) 10 MG disintegrating tablet Take 1 tablet (10 mg total) by mouth as needed for migraine. May repeat in 2 hours if needed 9 tablet 11   SUMAtriptan   (IMITREX ) 6 MG/0.5ML SOLN injection Inject 0.5 mLs (6 mg total) into the skin every 2 (two) hours as needed for migraine or headache. Take one dose at headache onset, can take additional dose 2hrs later if needed. No more then 2 injections in 24hrs 2.5 mL 6   Ubrogepant  (UBRELVY ) 100 MG TABS Take 1 tablet (100 mg total) by mouth every 2 (two) hours as needed. Maximum 200mg  a day. 16 tablet 11   UNABLE TO FIND Med Name: Bariatric Vitamin     Zavegepant HCl (ZAVZPRET ) 10 MG/ACT SOLN Place 1 spray into the nose daily as needed. 2 each 0   No facility-administered medications prior to visit.    ROS   Objective:   Vitals:   07/01/24 0922  BP: 115/82  Pulse: 60  Temp: 98.1 F (36.7 C)  SpO2: 100%  Weight: 238 lb 6.4 oz (108.1 kg)  Height: 5' 6 (1.676 m)   SpO2: 100 %  Physical Exam GENERAL: Well appearing, no acute distress. HEAD EARS NOSE THROAT: Normocephalic, atraumatic. EYES: Extraocular movements intact, no scleral icterus. RESPIRATORY: Clear to auscultation bilaterally, no crackles, wheezing or rales. CARDIOVASCULAR: Regular rate and rhythm, no murmurs, rubs, or gallops, no jugular venous distention. EXTREMITIES: No edema, no tenderness. NEUROLOGICAL: Alert and oriented x4, cranial nerves II-XII grossly intact. PSYCHIATRIC: Normal mood, normal affect.   Data Reviewed:  Imaging: CTA 12/21/23 - No PE, normal parenchyma, small calcified granuloma in the LUL  PFT: None on file  Labs: CBC    Component Value Date/Time   WBC 9.2 08/20/2023 1840   RBC 4.78 08/20/2023 1840   HGB 13.9 08/20/2023 1840   HCT 42.3 08/20/2023 1840   PLT 366 08/20/2023 1840   MCV 88.5 08/20/2023 1840   MCH 29.1 08/20/2023 1840   MCHC 32.9 08/20/2023 1840   RDW 12.0 08/20/2023 1840    Sleep:  PSG 07/11/19 - No OSA. Bruxism     Assessment & Plan:   Discussion: 39 y.o. female with hx gastric bypass, hx HTN, allergic rhinitis, migraine and anxiety who presents for shortness of breath.  Appears to be associated with meals so possible microaspiration occurring to cause dyspnea. CT chest reviewed with no chronic parenchymal findings. Possible reactive airway syndrome with close exposure to vaping. Deconditioning could be contributing as well. Will rule out any underlying lung disease with pulmonary function tests to determine if bronchodilators can play a role.   Assessment & Plan Shortness of breath --ORDER pulmonary function   Health Maintenance  There is no immunization history on file for this patient. CT Lung Screen - never smoker. Not qualified  Orders Placed This Encounter  Procedures   Pulmonary function test    Standing Status:   Future    Expiration Date:   07/01/2025  Where should this test be performed?:   Outpatient Pulmonary    What type of PFT is being ordered?:   Full PFT  No orders of the defined types were placed in this encounter.   Return for after PFT. OK with me or PA Candis.  I have spent a total time of 45-minutes on the day of the appointment reviewing prior documentation, coordinating care and discussing medical diagnosis and plan with the patient/family. Imaging, labs and tests included in this note have been reviewed and interpreted independently by me. This note is generated using [employer] programming. Patient/family has given consent.  Peyson Delao Slater Staff, MD [facility] 07/01/2024 11:43 AM        [1]  Allergies Allergen Reactions   Lactose Intolerance (Gi)    Nsaids     Patient stated she was told no nsaids whatsoever due to bariatric surgery.    Other     Alcohol- queasy, get hot, temp rises, get flushed  Patient states she was told not to take any STEROIDS due to her bariatric surgery   Latex Rash   "

## 2024-07-01 NOTE — Patient Instructions (Signed)
--  ORDER pulmonary function

## 2024-07-14 ENCOUNTER — Encounter (HOSPITAL_BASED_OUTPATIENT_CLINIC_OR_DEPARTMENT_OTHER): Payer: Self-pay

## 2024-07-14 ENCOUNTER — Ambulatory Visit (HOSPITAL_BASED_OUTPATIENT_CLINIC_OR_DEPARTMENT_OTHER)

## 2024-07-14 VITALS — BP 117/72 | HR 68 | Ht 66.0 in | Wt 235.0 lb

## 2024-07-14 DIAGNOSIS — R0602 Shortness of breath: Secondary | ICD-10-CM | POA: Diagnosis not present

## 2024-07-14 DIAGNOSIS — K219 Gastro-esophageal reflux disease without esophagitis: Secondary | ICD-10-CM | POA: Diagnosis not present

## 2024-07-14 LAB — PULMONARY FUNCTION TEST
DL/VA % pred: 119 %
DL/VA: 5.24 ml/min/mmHg/L
DLCO unc % pred: 111 %
DLCO unc: 26.13 ml/min/mmHg
FEF 25-75 Post: 4.57 L/s
FEF 25-75 Pre: 3.79 L/s
FEF2575-%Change-Post: 20 %
FEF2575-%Pred-Post: 139 %
FEF2575-%Pred-Pre: 115 %
FEV1-%Change-Post: 8 %
FEV1-%Pred-Post: 112 %
FEV1-%Pred-Pre: 103 %
FEV1-Post: 3.63 L
FEV1-Pre: 3.34 L
FEV1FVC-%Change-Post: 2 %
FEV1FVC-%Pred-Pre: 102 %
FEV6-%Change-Post: 6 %
FEV6-%Pred-Post: 107 %
FEV6-%Pred-Pre: 101 %
FEV6-Post: 4.17 L
FEV6-Pre: 3.93 L
FEV6FVC-%Pred-Post: 101 %
FEV6FVC-%Pred-Pre: 101 %
FVC-%Change-Post: 6 %
FVC-%Pred-Post: 105 %
FVC-%Pred-Pre: 99 %
FVC-Post: 4.17 L
FVC-Pre: 3.93 L
Post FEV1/FVC ratio: 87 %
Post FEV6/FVC ratio: 100 %
Pre FEV1/FVC ratio: 85 %
Pre FEV6/FVC Ratio: 100 %
RV % pred: 125 %
RV: 2.1 L
TLC % pred: 118 %
TLC: 6.35 L

## 2024-07-14 MED ORDER — LEVALBUTEROL TARTRATE 45 MCG/ACT IN AERO: 2.0000 | INHALATION_SPRAY | RESPIRATORY_TRACT | Status: DC | PRN

## 2024-07-14 NOTE — Progress Notes (Signed)
 Full PFT performed today.

## 2024-07-14 NOTE — Patient Instructions (Signed)
 Full PFT performed today.

## 2024-07-14 NOTE — Patient Instructions (Signed)
 Start Xopenex  2 puffs inhaled every 6 hours as needed for shortness of breath; if no benefit after a week of using, discontinue use.  Keep follow up with GI as scheduled this week.  Follow up with in 4 months.

## 2024-07-14 NOTE — Progress Notes (Signed)
 "  @Patient  ID: Wendy Tapia, female    DOB: 12/06/1985, 39 y.o.   MRN: [id number]  Chief Complaint  Patient presents with   Follow-up    PFT     Referring provider: Verena Mems, MD  HPI: Discussed the use of AI scribe software for clinical note transcription with the patient, who gave verbal consent to proceed.  History of Present Illness Wendy Tapia is a 39 year old female who presents with persistent shortness of breath.  She experiences persistent shortness of breath, feeling like she is not getting enough air, with oximeter readings between 96-100%. The shortness of breath occurs constantly, including when lying down, despite using an elevated pillow. These symptoms have been ongoing since the beginning of last year and worsened after a severe GERD episode in November. Albuterol administered during her PFTs today caused jitteriness.  She has a history of GERD, which has been problematic for some time. She describes a burning sensation in her throat, especially when eating, and a persistent cough after consuming a protein shake. An endoscopy performed in December was clear, and she is scheduled to follow up with her gastroenterologist later this week.  She had a sleep evaluation in 2021, which was negative for OSA. She has had a CT angiogram in June and a chest X-ray in December, both of which were normal.  She is actively trying to conceive and is concerned about the safety of medications during pregnancy.  No chest tightness and choking. Reports throat burning when eating and occasional need to clear throat.  Last OV 07/01/2024: Wendy Tapia is a 38 year old female with a history of gastric bypass surgery and silent acid reflux who presents with breathing issues.   She has experienced breathing difficulties since 2025, characterized by a sensation of not getting enough air, particularly when eating. She sometimes feels chest tightness, described as 'an elephant  sitting on my chest.' There is no wheezing or frequent choking, but occasional shortness of breath occurs when getting up or after meals. Home pulse oximeter readings show oxygen levels between 97% and 100%.   She underwent gastric bypass surgery on September 25, 2019, and has a history of silent acid reflux, which initially improved post-surgery but has had flare-ups, notably in November 2025. Despite maximum doses of pantoprazole and evening famotidine, symptoms persisted. She adheres to lifestyle modifications, such as avoiding trigger foods, not using straws, and sleeping elevated. An endoscopy revealed no significant findings.   Her family history includes asthma affecting her father and brother. She experiences intermittent anxiety, for which she has medication but has not taken it recently. She works from home in a sedentary job and lives in a small, one-level house. She is exposed to secondhand vape smoke indoors.   In early 2025, she visited the hospital for similar symptoms, where tests ruled out blood clots. A sleep apnea test in 2021 was negative. She denies any history of smoking but is exposed to secondhand vape smoke at home.   TEST/EVENTS :   PFTs 07/14/2024:  Normal spirometry, lung volumes, and DLCO.  8% response to BD Imaging: CTA 12/21/23 - No PE, normal parenchyma, small calcified granuloma in the LUL  Sleep:  PSG 07/11/19 - No OSA. Bruxism Allergies[1]   There is no immunization history on file for this patient.  Past Medical History:  Diagnosis Date   Headache    migraine   High blood pressure    PCOS (polycystic ovarian syndrome)    Thyroid  disease  Tobacco History: Tobacco Use History[2] Counseling given: Not Answered   Outpatient Medications Prior to Visit  Medication Sig Dispense Refill   cetirizine (ZYRTEC) 10 MG tablet Take 10 mg by mouth daily as needed for allergies.     Inositol-D Chiro-Inositol (OVASITOL PO) Take by mouth.     levothyroxine  (SYNTHROID, LEVOTHROID) 50 MCG tablet Take 50 mcg by mouth daily before breakfast.     pantoprazole (PROTONIX) 40 MG tablet Take by mouth.     rizatriptan  (MAXALT -MLT) 10 MG disintegrating tablet Take 1 tablet (10 mg total) by mouth as needed for migraine. May repeat in 2 hours if needed 9 tablet 11   Ubrogepant  (UBRELVY ) 100 MG TABS Take 1 tablet (100 mg total) by mouth every 2 (two) hours as needed. Maximum 200mg  a day. 16 tablet 11   UNABLE TO FIND Med Name: Bariatric Vitamin     cyclobenzaprine  (FLEXERIL ) 10 MG tablet TAKE 1/2 TO 1 TABLET (5-10 MG TOTAL) BY MOUTH EVERY DAY AT BEDTIME 30 tablet 0   Rimegepant Sulfate (NURTEC) 75 MG TBDP Take 1 tablet (75 mg total) by mouth daily as needed. For migraines. Take as close to onset of migraine as possible. One daily maximum. 4 tablet 0   SUMAtriptan  (IMITREX ) 6 MG/0.5ML SOLN injection Inject 0.5 mLs (6 mg total) into the skin every 2 (two) hours as needed for migraine or headache. Take one dose at headache onset, can take additional dose 2hrs later if needed. No more then 2 injections in 24hrs 2.5 mL 6   Zavegepant HCl (ZAVZPRET ) 10 MG/ACT SOLN Place 1 spray into the nose daily as needed. 2 each 0   No facility-administered medications prior to visit.     Review of Systems: as per hpi  Constitutional:   No  weight loss, night sweats,  Fevers, chills, fatigue, or  lassitude.  HEENT:   No headaches,  Difficulty swallowing,  Tooth/dental problems, or  Sore throat,                No sneezing, itching, ear ache, nasal congestion, post nasal drip,   CV:  No chest pain,  Orthopnea, PND, swelling in lower extremities, anasarca, dizziness, palpitations, syncope.   GI  No heartburn, indigestion, abdominal pain, nausea, vomiting, diarrhea, change in bowel habits, loss of appetite, bloody stools.   Resp: No shortness of breath with exertion or at rest.  No excess mucus, no productive cough,  No non-productive cough,  No coughing up of blood.  No change in  color of mucus.  No wheezing.  No chest wall deformity  Skin: no rash or lesions.  GU: no dysuria, change in color of urine, no urgency or frequency.  No flank pain, no hematuria   MS:  No joint pain or swelling.  No decreased range of motion.  No back pain.    Physical Exam  BP 117/72   Pulse 68   Ht 5' 6 (1.676 m)   Wt 235 lb (106.6 kg)   SpO2 100%   BMI 37.93 kg/m   GEN: A/Ox3; pleasant , NAD, well nourished    HEENT:  [place]/AT,  EACs-clear, TMs-wnl, NOSE-clear, THROAT-clear, no lesions, no postnasal drip or exudate noted.   NECK:  Supple w/ fair ROM; no JVD; normal carotid impulses w/o bruits; no thyromegaly or nodules palpated; no lymphadenopathy.    RESP  Clear  P & A; w/o, wheezes/ rales/ or rhonchi. no accessory muscle use, no dullness to percussion  CARD:  RRR, no m/r/g,  no peripheral edema, pulses intact, no cyanosis or clubbing.  GI:   Soft & nt; nml bowel sounds; no organomegaly or masses detected.   Musco: Warm bil, no deformities or joint swelling noted.   Neuro: alert, no focal deficits noted.    Skin: Warm, no lesions or rashes    Lab Results:  CBC    Component Value Date/Time   WBC 9.2 08/20/2023 1840   RBC 4.78 08/20/2023 1840   HGB 13.9 08/20/2023 1840   HCT 42.3 08/20/2023 1840   PLT 366 08/20/2023 1840   MCV 88.5 08/20/2023 1840   MCH 29.1 08/20/2023 1840   MCHC 32.9 08/20/2023 1840   RDW 12.0 08/20/2023 1840    BMET    Component Value Date/Time   NA 137 08/20/2023 1840   K 4.0 08/20/2023 1840   CL 102 08/20/2023 1840   CO2 25 08/20/2023 1840   GLUCOSE 91 08/20/2023 1840   BUN 15 08/20/2023 1840   CREATININE 0.77 08/20/2023 1840   CALCIUM 9.4 08/20/2023 1840   GFRNONAA >60 08/20/2023 1840   GFRAA >60 12/29/2017 1825    BNP No results found for: BNP  ProBNP No results found for: PROBNP  Imaging: No results found.  Administration History     None          Latest Ref Rng & Units 07/14/2024   12:44 PM  PFT  Results  FVC-Pre L 3.93  P  FVC-Predicted Pre % 99  P  FVC-Post L 4.17  P  FVC-Predicted Post % 105  P  Pre FEV1/FVC % % 85  P  Post FEV1/FCV % % 87  P  FEV1-Pre L 3.34  P  FEV1-Predicted Pre % 103  P  FEV1-Post L 3.63  P  DLCO uncorrected ml/min/mmHg 26.13  P  DLCO UNC% % 111  P  DLVA Predicted % 119  P  TLC L 6.35  P  TLC % Predicted % 118  P  RV % Predicted % 125  P    P Preliminary result    No results found for: NITRICOXIDE   Assessment & Plan:   Assessment & Plan Shortness of breath 39 y.o. female with hx gastric bypass, hx HTN, allergic rhinitis, migraine and anxiety who presents for shortness of breath. Some association of symptoms with eating so microaspiration considered a potential cause.  Assessment and Plan Assessment & Plan Shortness of breath and cough likely due to microaspiration from gastroesophageal reflux disease (GERD) Persistent symptoms likely due to microaspiration from GERD. Lung function tests normal, slight improvement post-albuterol not significant. GERD symptoms present, endoscopy clear. Microaspiration suspected to cause inflammation. Albuterol caused jitteriness, Xopenex  considered due to fewer side effects. - Prescribed Xopenex  inhaler, 1-2 puffs every 6 hours as needed. - Advised trial of Xopenex  for a few days to assess effectiveness. - Continue follow-up with gastroenterologist for GERD evaluation and potential swallow function tests. - Scheduled follow-up in 4 months, with option to return sooner if needed.   Return in about 4 months (around 11/11/2024).  Candis Dandy, PA-C 07/14/2024      [1]  Allergies Allergen Reactions   Lactose Intolerance (Gi)    Nsaids     Patient stated she was told no nsaids whatsoever due to bariatric surgery.    Other     Alcohol- queasy, get hot, temp rises, get flushed  Patient states she was told not to take any STEROIDS due to her bariatric surgery   Latex Rash  [2]  Social  History Tobacco  Use  Smoking Status Never  Smokeless Tobacco Never   "

## 2024-07-17 ENCOUNTER — Other Ambulatory Visit (HOSPITAL_BASED_OUTPATIENT_CLINIC_OR_DEPARTMENT_OTHER): Payer: Self-pay

## 2024-07-17 MED ORDER — LEVALBUTEROL TARTRATE 45 MCG/ACT IN AERO: 2.0000 | INHALATION_SPRAY | RESPIRATORY_TRACT | 1 refills | Status: AC | PRN

## 2024-08-05 ENCOUNTER — Ambulatory Visit (HOSPITAL_BASED_OUTPATIENT_CLINIC_OR_DEPARTMENT_OTHER)

## 2024-08-05 ENCOUNTER — Encounter (HOSPITAL_BASED_OUTPATIENT_CLINIC_OR_DEPARTMENT_OTHER)

## 2024-11-11 ENCOUNTER — Ambulatory Visit (HOSPITAL_BASED_OUTPATIENT_CLINIC_OR_DEPARTMENT_OTHER)
